# Patient Record
Sex: Female | Born: 1975 | Race: White | Hispanic: No | Marital: Married | Smoking: Never smoker
Health system: Southern US, Community
[De-identification: ages and names within clinical notes are randomized; demographics above are authoritative.]

## PROBLEM LIST (undated history)

## (undated) HISTORY — PX: KNEE SURGERY: SHX244

## (undated) HISTORY — PX: CARPAL TUNNEL RELEASE: SHX101

## (undated) HISTORY — PX: THYROIDECTOMY: SHX17

## (undated) HISTORY — PX: ABDOMINAL HYSTERECTOMY: SHX81

---

## 2016-05-10 ENCOUNTER — Emergency Department (HOSPITAL_BASED_OUTPATIENT_CLINIC_OR_DEPARTMENT_OTHER): Payer: BC Managed Care – PPO

## 2016-05-10 ENCOUNTER — Emergency Department (HOSPITAL_BASED_OUTPATIENT_CLINIC_OR_DEPARTMENT_OTHER)
Admission: EM | Admit: 2016-05-10 | Discharge: 2016-05-10 | Disposition: A | Discharge status: Home / Self Care | Payer: BC Managed Care – PPO | Attending: Emergency Medicine | Admitting: Emergency Medicine

## 2016-05-10 ENCOUNTER — Encounter (HOSPITAL_BASED_OUTPATIENT_CLINIC_OR_DEPARTMENT_OTHER): Payer: Self-pay | Admitting: *Deleted

## 2016-05-10 DIAGNOSIS — M25512 Pain in left shoulder: Secondary | ICD-10-CM

## 2016-05-10 DIAGNOSIS — R0602 Shortness of breath: Secondary | ICD-10-CM | POA: Insufficient documentation

## 2016-05-10 DIAGNOSIS — R079 Chest pain, unspecified: Secondary | ICD-10-CM | POA: Diagnosis present

## 2016-05-10 MED ORDER — LIDOCAINE 5 % EX PTCH: 1.0000 | MEDICATED_PATCH | CUTANEOUS | 0 refills | Status: DC

## 2016-05-10 MED ORDER — CYCLOBENZAPRINE HCL 10 MG PO TABS: 10.0000 mg | ORAL_TABLET | Freq: Two times a day (BID) | ORAL | 0 refills | Status: DC | PRN

## 2016-05-10 NOTE — Discharge Instructions (Signed)

## 2016-05-10 NOTE — ED Triage Notes (Signed)
Pain in her back that goes into her chest. SOB when she takes a deep breath. She had a steroid injection in her scapulas 5 days ago for numbness in both of her hands. Numbness went away after the injection.

## 2016-05-10 NOTE — ED Notes (Signed)
ED Provider at bedside. 

## 2016-05-10 NOTE — ED Provider Notes (Signed)
Emergency Department Provider Note By signing my name below, I, Teofilo PodMatthew P. Jamison, attest that this documentation has been prepared under the direction and in the presence of Maia PlanJoshua G Long, MD . Electronically Signed: Teofilo PodMatthew P. Jamison, ED Scribe. 05/10/2016. 6:15 PM.   I have reviewed the triage vital signs and the nursing notes.   HISTORY  Chief Complaint Shortness of Breath and Chest Pain   HPI Tammy Benjamin is a 40 y.o. female who presents to the Emergency Department complaining of worsening pain to her bilateral shoulder blades, duration unknown. Pt reports that the pain radiates back and forth between her left and right shoulder blades, to her chest and down her arms. Pt complains of associated numbness to both of her hands and SOB when taking deep breaths. Pt had a steroid shot in her shoulders 5 days ago that provided moderate, temporary relief for the numbness. Pt denies other associated symptoms. Patient states she's been to multiple physicians for this in the recent past with imaging of her neck.    History reviewed. No pertinent past medical history.  There are no active problems to display for this patient.   Past Surgical History:  Procedure Laterality Date  . ABDOMINAL HYSTERECTOMY    . CARPAL TUNNEL RELEASE    . KNEE SURGERY    . THYROIDECTOMY        Allergies Codeine and Penicillins  No family history on file.  Social History Social History  Substance Use Topics  . Smoking status: Never Smoker  . Smokeless tobacco: Never Used  . Alcohol use Yes    Review of Systems  Constitutional: No fever/chills Eyes: No visual changes. ENT: No sore throat. Cardiovascular: Denies chest pain. Respiratory: Positive for shortness of breath. Gastrointestinal: No abdominal pain.  No nausea, no vomiting.  No diarrhea.  No constipation. Genitourinary: Negative for dysuria. Musculoskeletal: Positive for back/shoulder pain.  Skin: Negative for  rash. Neurological: Positive for numbness. Negative for headaches, focal weakness.  10-point ROS otherwise negative.  ____________________________________________   PHYSICAL EXAM:  VITAL SIGNS: ED Triage Vitals  Enc Vitals Group     BP 05/10/16 1756 (!) 153/104     Pulse Rate 05/10/16 1756 71     Resp 05/10/16 1756 18     Temp 05/10/16 1756 98.1 F (36.7 C)     Temp Source 05/10/16 1756 Oral     SpO2 05/10/16 1756 99 %     Weight 05/10/16 1753 190 lb (86.2 kg)     Height 05/10/16 1753 5' (1.524 m)     Pain Score 05/10/16 1753 7   Constitutional: Alert and oriented. Well appearing and in no acute distress. Eyes: Conjunctivae are normal.  Head: Atraumatic. Nose: No congestion/rhinnorhea. Mouth/Throat: Mucous membranes are moist.  Oropharynx non-erythematous. Neck: No stridor. No cervical spine tenderness to palpation. Cardiovascular: Normal rate, regular rhythm. Good peripheral circulation. Grossly normal heart sounds.   Respiratory: Normal respiratory effort.  No retractions. Lungs CTAB. Gastrointestinal: Soft and nontender. No distention.  Musculoskeletal: No lower extremity tenderness nor edema. No gross deformities of extremities. Tenderness to palpation of the left posterior shoulder. Worse with movement or palpation.  Neurologic:  Normal speech and language. No gross focal neurologic deficits are appreciated.  Skin:  Skin is warm, dry and intact. No rash noted. ____________________________________________  EKG   EKG Interpretation  Date/Time:  Monday May 10 2016 18:00:25 EST Ventricular Rate:  74 PR Interval:  134 QRS Duration: 84 QT Interval:  384 QTC Calculation: 426  R Axis:   54 Text Interpretation:  Normal sinus rhythm Normal ECG No STEMI.  Confirmed by LONG MD, JOSHUA [phone]([id number]) on 05/10/2016 6:40:01 PM     '  ____________________________________________  RADIOLOGY  Dg Chest 2 View  Result Date: 05/10/2016 CLINICAL DATA:  Left shoulder blade pain  for few months and dyspnea new new EXAM: CHEST  2 VIEW COMPARISON:  None. FINDINGS: The heart size and mediastinal contours are within normal limits. Both lungs are clear. The visualized skeletal structures are unremarkable. IMPRESSION: No active cardiopulmonary disease. No acute osseous appearing abnormality. Electronically Signed   By: Tollie Ethavid  Kwon M.D.   On: 05/10/2016 18:34    ____________________________________________   PROCEDURES  Procedure(s) performed:   Procedures  None ____________________________________________   INITIAL IMPRESSION / ASSESSMENT AND PLAN / ED COURSE  Pertinent labs & imaging results that were available during my care of the patient were reviewed by me and considered in my medical decision making (see chart for details).  Patient presents to the emergency room in for evaluation of likely musculoskeletal posterior left shoulder pain. Symptoms have been ongoing for several weeks to months but worsened significantly over the past 48 hours and the patient is complaining of severe pain. Pain is nonpleuritic. The patient is low risk for pulmonary embolism. PERC negative. The pain is worse with palpation and movement. I have very low suspicion for atypical ACS. EKG is nonischemic. CXR with no acute abnormality. Discharge home with Flexeril and Lidoderm patch.   At this time, I do not feel there is any life-threatening condition present. I have reviewed and discussed all results (EKG, imaging, lab, urine as appropriate), exam findings with patient. I have reviewed nursing notes and appropriate previous records.  I feel the patient is safe to be discharged home without further emergent workup. Discussed usual and customary return precautions. Patient and family (if present) verbalize understanding and are comfortable with this plan.  Patient will follow-up with their primary care provider. If they do not have a primary care provider, information for follow-up has been  provided to them. All questions have been answered.  ____________________________________________  FINAL CLINICAL IMPRESSION(S) / ED DIAGNOSES  Final diagnoses:  Acute pain of left shoulder     MEDICATIONS GIVEN DURING THIS VISIT:  None  NEW OUTPATIENT MEDICATIONS STARTED DURING THIS VISIT:  Discharge Medication List as of 05/10/2016  6:53 PM    START taking these medications   Details  cyclobenzaprine (FLEXERIL) 10 MG tablet Take 1 tablet (10 mg total) by mouth 2 (two) times daily as needed for muscle spasms., Starting Mon 05/10/2016, Print    lidocaine (LIDODERM) 5 % Place 1 patch onto the skin daily. Remove & Discard patch within 12 hours or as directed by MD, Starting Mon 05/10/2016, Print          Note:  This document was prepared using Dragon voice recognition software and may include unintentional dictation errors.  Alona BeneJoshua Long, MD Emergency Medicine  I personally performed the services described in this documentation, which was scribed in my presence. The recorded information has been reviewed and is accurate.       Maia PlanJoshua G Long, MD 05/10/16 2239

## 2018-05-23 ENCOUNTER — Emergency Department (HOSPITAL_BASED_OUTPATIENT_CLINIC_OR_DEPARTMENT_OTHER): Payer: BC Managed Care – PPO

## 2018-05-23 ENCOUNTER — Inpatient Hospital Stay (HOSPITAL_BASED_OUTPATIENT_CLINIC_OR_DEPARTMENT_OTHER)
Admission: EM | Admit: 2018-05-23 | Discharge: 2018-05-26 | DRG: 690 | Disposition: A | Discharge status: Home / Self Care | Payer: BC Managed Care – PPO | Attending: Internal Medicine | Admitting: Internal Medicine

## 2018-05-23 ENCOUNTER — Encounter (HOSPITAL_BASED_OUTPATIENT_CLINIC_OR_DEPARTMENT_OTHER): Payer: Self-pay | Admitting: *Deleted

## 2018-05-23 ENCOUNTER — Other Ambulatory Visit: Payer: Self-pay

## 2018-05-23 DIAGNOSIS — E876 Hypokalemia: Secondary | ICD-10-CM | POA: Diagnosis not present

## 2018-05-23 DIAGNOSIS — M5412 Radiculopathy, cervical region: Secondary | ICD-10-CM

## 2018-05-23 DIAGNOSIS — N10 Acute tubulo-interstitial nephritis: Principal | ICD-10-CM | POA: Diagnosis present

## 2018-05-23 DIAGNOSIS — Z9049 Acquired absence of other specified parts of digestive tract: Secondary | ICD-10-CM | POA: Diagnosis not present

## 2018-05-23 DIAGNOSIS — Z90710 Acquired absence of both cervix and uterus: Secondary | ICD-10-CM

## 2018-05-23 DIAGNOSIS — R945 Abnormal results of liver function studies: Secondary | ICD-10-CM

## 2018-05-23 DIAGNOSIS — E890 Postprocedural hypothyroidism: Secondary | ICD-10-CM | POA: Diagnosis not present

## 2018-05-23 DIAGNOSIS — Z79899 Other long term (current) drug therapy: Secondary | ICD-10-CM | POA: Diagnosis not present

## 2018-05-23 DIAGNOSIS — R7989 Other specified abnormal findings of blood chemistry: Secondary | ICD-10-CM

## 2018-05-23 DIAGNOSIS — N12 Tubulo-interstitial nephritis, not specified as acute or chronic: Secondary | ICD-10-CM | POA: Diagnosis present

## 2018-05-23 DIAGNOSIS — R109 Unspecified abdominal pain: Secondary | ICD-10-CM | POA: Diagnosis present

## 2018-05-23 LAB — CBC WITH DIFFERENTIAL/PLATELET
Abs Immature Granulocytes: 0.03 10*3/uL (ref 0.00–0.07)
Basophils Absolute: 0 10*3/uL (ref 0.0–0.1)
Basophils Relative: 0 %
EOS ABS: 0.1 10*3/uL (ref 0.0–0.5)
EOS PCT: 1 %
HEMATOCRIT: 39.5 % (ref 36.0–46.0)
Hemoglobin: 13.4 g/dL (ref 12.0–15.0)
IMMATURE GRANULOCYTES: 0 %
LYMPHS ABS: 1.7 10*3/uL (ref 0.7–4.0)
Lymphocytes Relative: 17 %
MCH: 29.8 pg (ref 26.0–34.0)
MCHC: 33.9 g/dL (ref 30.0–36.0)
MCV: 87.8 fL (ref 80.0–100.0)
MONOS PCT: 10 %
Monocytes Absolute: 1 10*3/uL (ref 0.1–1.0)
Neutro Abs: 7.1 10*3/uL (ref 1.7–7.7)
Neutrophils Relative %: 72 %
Platelets: 223 10*3/uL (ref 150–400)
RBC: 4.5 MIL/uL (ref 3.87–5.11)
RDW: 12.1 % (ref 11.5–15.5)
WBC: 10.1 10*3/uL (ref 4.0–10.5)
nRBC: 0 % (ref 0.0–0.2)

## 2018-05-23 LAB — URINALYSIS, ROUTINE W REFLEX MICROSCOPIC
Bilirubin Urine: NEGATIVE
Glucose, UA: 100 mg/dL — AB
KETONES UR: NEGATIVE mg/dL
NITRITE: POSITIVE — AB
PROTEIN: 100 mg/dL — AB
Specific Gravity, Urine: 1.01 (ref 1.005–1.030)
pH: 6.5 (ref 5.0–8.0)

## 2018-05-23 LAB — COMPREHENSIVE METABOLIC PANEL
ALK PHOS: 54 U/L (ref 38–126)
ALT: 14 U/L (ref 0–44)
AST: 15 U/L (ref 15–41)
Albumin: 4 g/dL (ref 3.5–5.0)
Anion gap: 9 (ref 5–15)
BILIRUBIN TOTAL: 0.5 mg/dL (ref 0.3–1.2)
BUN: 15 mg/dL (ref 6–20)
CO2: 28 mmol/L (ref 22–32)
Calcium: 9.2 mg/dL (ref 8.9–10.3)
Chloride: 103 mmol/L (ref 98–111)
Creatinine, Ser: 0.81 mg/dL (ref 0.44–1.00)
GFR calc Af Amer: >60 mL/min (ref >60)
Glucose, Bld: 92 mg/dL (ref 70–99)
POTASSIUM: 2.8 mmol/L — AB (ref 3.5–5.1)
Sodium: 140 mmol/L (ref 135–145)
Total Protein: 6.8 g/dL (ref 6.5–8.1)

## 2018-05-23 LAB — URINALYSIS, MICROSCOPIC (REFLEX)

## 2018-05-23 LAB — LIPASE, BLOOD: Lipase: 41 U/L (ref 11–51)

## 2018-05-23 LAB — HCG, SERUM, QUALITATIVE: PREG SERUM: NEGATIVE

## 2018-05-23 MED ORDER — POTASSIUM CHLORIDE CRYS ER 20 MEQ PO TBCR
40.0000 meq | EXTENDED_RELEASE_TABLET | Freq: Once | ORAL | Status: AC
  Administered 2018-05-23: 40 meq via ORAL
  Filled 2018-05-23: qty 2

## 2018-05-23 MED ORDER — IOPAMIDOL (ISOVUE-300) INJECTION 61%
100.0000 mL | Freq: Once | INTRAVENOUS | Status: AC | PRN
  Administered 2018-05-23: 100 mL via INTRAVENOUS

## 2018-05-23 MED ORDER — HYDROMORPHONE HCL 1 MG/ML IJ SOLN
0.5000 mg | Freq: Once | INTRAMUSCULAR | Status: AC
  Administered 2018-05-23: 0.5 mg via INTRAVENOUS
  Filled 2018-05-23: qty 1

## 2018-05-23 MED ORDER — SODIUM CHLORIDE 0.9 % IV BOLUS
1000.0000 mL | Freq: Once | INTRAVENOUS | Status: AC
  Administered 2018-05-23: 1000 mL via INTRAVENOUS

## 2018-05-23 MED ORDER — OXYCODONE-ACETAMINOPHEN 5-325 MG PO TABS
1.0000 | ORAL_TABLET | Freq: Once | ORAL | Status: AC
  Administered 2018-05-23: 1 via ORAL
  Filled 2018-05-23: qty 1

## 2018-05-23 MED ORDER — SODIUM CHLORIDE 0.9 % IV SOLN
1.0000 g | Freq: Once | INTRAVENOUS | Status: AC
  Administered 2018-05-23: 1 g via INTRAVENOUS
  Filled 2018-05-23: qty 10

## 2018-05-23 NOTE — ED Provider Notes (Signed)
[facility] EMERGENCY DEPARTMENT Provider Note   CSN: [id number] Arrival date & time: 05/23/18  1719     History   Chief Complaint Chief Complaint  Patient presents with  . Abdominal Pain    HPI Tammy Benjamin is a 42 y.o. female presents today for right-sided flank and abdominal pain that began 7 days ago.  Patient states that the pain began as a mild intensity aching pain with associated dysuria however has progressed since that time.  Patient states that she was evaluated by a tele-doc 5 days ago and prescribed Macrobid, tamsulosin and given follow-up precautions.  Patient states that her symptoms have continued to worsen since that time.  She states that she vomited once 4 days ago nonbloody, nonbilious.  Patient states that her pain is now 10/10 in severity, constant and throbbing in nature radiating from right flank to right abdomen.  Patient also endorses dysuria, urinary frequency as well as new onset nonbloody diarrhea that began yesterday.  HPI  History reviewed. No pertinent past medical history.  Patient Active Problem List   Diagnosis Date Noted  . Pyelonephritis of right kidney 05/24/2018  . Cervical radiculopathy 05/24/2018  . Pyelonephritis 05/23/2018    Past Surgical History:  Procedure Laterality Date  . ABDOMINAL HYSTERECTOMY    . CARPAL TUNNEL RELEASE    . KNEE SURGERY    . THYROIDECTOMY       OB History   None      Home Medications    Prior to Admission medications   Medication Sig Start Date End Date Taking? Authorizing Provider  celecoxib (CELEBREX) 100 MG capsule Take 100 mg by mouth 2 (two) times daily.   Yes [provider]  diphenhydramine-acetaminophen (TYLENOL PM) 25-500 MG TABS tablet Take 2 tablets by mouth at bedtime as needed (sleep).   Yes [provider]  escitalopram (LEXAPRO) 10 MG tablet Take 10 mg by mouth daily. 03/20/18  Yes [provider]  gabapentin (NEURONTIN) 100 MG capsule Take 100 mg  by mouth 3 (three) times daily. 05/02/18  Yes [provider]  pregabalin (LYRICA) 50 MG capsule Take 50 mg by mouth 3 (three) times daily.   Yes [provider]  tiZANidine (ZANAFLEX) 2 MG tablet Take 2 mg by mouth every 6 (six) hours as needed for muscle spasms.  05/01/18  Yes [provider]  tiZANidine (ZANAFLEX) 4 MG tablet Take 4 mg by mouth every 6 (six) hours as needed for muscle spasms.   Yes [provider]  VYVANSE 70 MG capsule Take 70 mg by mouth daily as needed (focus).  05/01/18  Yes [provider]  cyclobenzaprine (FLEXERIL) 10 MG tablet Take 1 tablet (10 mg total) by mouth 2 (two) times daily as needed for muscle spasms. Patient not taking: Reported on 05/24/2018 05/10/16   Long, Arlyss Repress, MD  lidocaine (LIDODERM) 5 % Place 1 patch onto the skin daily. Remove & Discard patch within 12 hours or as directed by MD Patient not taking: Reported on 05/24/2018 05/10/16   Long, Arlyss Repress, MD    Family History No family history on file.  Social History Social History   Tobacco Use  . Smoking status: Never Smoker  . Smokeless tobacco: Never Used  Substance Use Topics  . Alcohol use: Yes  . Drug use: No     Allergies   Bee venom; Hydrocodone; and Penicillins   Review of Systems Review of Systems  Constitutional: Negative.  Negative for chills and fever.  HENT: Negative.  Negative for rhinorrhea and sore throat.   Eyes: Negative.  Negative for visual disturbance.  Respiratory: Negative.  Negative for cough and shortness of breath.   Cardiovascular: Negative.  Negative for chest pain.  Gastrointestinal: Positive for abdominal pain, diarrhea and vomiting. Negative for blood in stool.  Genitourinary: Positive for dysuria, flank pain and frequency. Negative for hematuria, vaginal bleeding and vaginal discharge.  Musculoskeletal: Negative for arthralgias and myalgias.  Skin: Negative.  Negative for rash.  Neurological: Negative.   Negative for dizziness, weakness and headaches.   Physical Exam Updated Vital Signs BP 122/73 (BP Location: Left Arm)   Pulse (!) 57   Temp (!) 97.4 F (36.3 C) (Oral)   Resp 17   Ht 5' (1.524 m)   Wt 86.2 kg   SpO2 99%   BMI 37.11 kg/m   Physical Exam  Constitutional: She appears well-developed and well-nourished. No distress.  HENT:  Head: Normocephalic and atraumatic.  Right Ear: External ear normal.  Left Ear: External ear normal.  Nose: Nose normal.  Mouth/Throat: Uvula is midline, oropharynx is clear and moist and mucous membranes are normal.  Well-healed surgical scar post thyroidectomy.  Eyes: Pupils are equal, round, and reactive to light. Conjunctivae and EOM are normal.  Neck: Trachea normal, normal range of motion, full passive range of motion without pain and phonation normal. Neck supple. No tracheal tenderness present. No tracheal deviation present.  Cardiovascular: Normal rate and regular rhythm.  Pulses:      Radial pulses are 2+ on the right side, and 2+ on the left side.       Dorsalis pedis pulses are 2+ on the right side, and 2+ on the left side.       Posterior tibial pulses are 2+ on the right side, and 2+ on the left side.  Pulmonary/Chest: Effort normal and breath sounds normal. No respiratory distress. She exhibits no tenderness, no crepitus and no deformity.  Abdominal: Soft. There is tenderness in the right upper quadrant and right lower quadrant. There is rebound, CVA tenderness and tenderness at McBurney's point. There is no guarding and negative Murphy's sign.  Genitourinary:  Genitourinary Comments: Deferred by patient  Musculoskeletal: Normal range of motion.       Back:       Right lower leg: Normal.       Left lower leg: Normal.  Patient with right-sided flank tenderness.  No crepitus step-off or deformity of the spine.  No midline spinal tenderness to palpation.  No signs of injury to the back.  Feet:  Right Foot:  Protective Sensation:  3 sites tested. 3 sites sensed.  Left Foot:  Protective Sensation: 3 sites tested. 3 sites sensed.  Neurological: She is alert. GCS eye subscore is 4. GCS verbal subscore is 5. GCS motor subscore is 6.  Speech is clear and goal oriented, follows commands Major Cranial nerves without deficit, no facial droop Normal strength in upper and lower extremities bilaterally including dorsiflexion and plantar flexion, strong and equal grip strength Sensation normal to light touch Moves extremities without ataxia, coordination intact  Skin: Skin is warm and dry.  Psychiatric: She has a normal mood and affect. Her behavior is normal.   ED Treatments / Results  Labs (all labs ordered are listed, but only abnormal results are displayed) Labs Reviewed  URINE CULTURE - Abnormal; Notable for the following components:      Result Value   Culture MULTIPLE SPECIES PRESENT, SUGGEST RECOLLECTION (*)  All other components within normal limits  URINALYSIS, ROUTINE W REFLEX MICROSCOPIC - Abnormal; Notable for the following components:   APPearance CLOUDY (*)    Glucose, UA 100 (*)    Hgb urine dipstick MODERATE (*)    Protein, ur 100 (*)    Nitrite POSITIVE (*)    Leukocytes, UA LARGE (*)    All other components within normal limits  URINALYSIS, MICROSCOPIC (REFLEX) - Abnormal; Notable for the following components:   Bacteria, UA FEW (*)    All other components within normal limits  COMPREHENSIVE METABOLIC PANEL - Abnormal; Notable for the following components:   Potassium 2.8 (*)    All other components within normal limits  BASIC METABOLIC PANEL - Abnormal; Notable for the following components:   Calcium 8.5 (*)    Anion gap 4 (*)    All other components within normal limits  COMPREHENSIVE METABOLIC PANEL - Abnormal; Notable for the following components:   Glucose, Bld 103 (*)    Calcium 8.5 (*)    Total Protein 6.2 (*)    Albumin 3.4 (*)    AST 65 (*)    ALT 132 (*)    All other components  within normal limits  CBC WITH DIFFERENTIAL/PLATELET  LIPASE, BLOOD  HCG, SERUM, QUALITATIVE  HIV ANTIBODY (ROUTINE TESTING W REFLEX)  CBC  MAGNESIUM  CBC  MAGNESIUM  CBC WITH DIFFERENTIAL/PLATELET  COMPREHENSIVE METABOLIC PANEL  MAGNESIUM  HEPATITIS PANEL, ACUTE    EKG EKG Interpretation  Date/Time:  Wednesday May 24 2018 00:27:06 EST Ventricular Rate:  70 PR Interval:    QRS Duration: 97 QT Interval:  417 QTC Calculation: 450 R Axis:   30 Text Interpretation:  Sinus rhythm No significant change since last tracing Confirmed by Melene PlanFloyd, Dan [phone]([id number]) on 05/24/2018 11:26:35 AM   Radiology Ct Abdomen Pelvis W Contrast  Result Date: 05/23/2018 CLINICAL DATA:  Right flank and abdominal pain. Recent treatment for urinary tract infection and renal stone. Prior cholecystectomy and hysterectomy. EXAM: CT ABDOMEN AND PELVIS WITH CONTRAST TECHNIQUE: Multidetector CT imaging of the abdomen and pelvis was performed using the standard protocol following bolus administration of intravenous contrast. CONTRAST:  100mL ISOVUE-300 IOPAMIDOL (ISOVUE-300) INJECTION 61% COMPARISON:  None. FINDINGS: Lower chest: No significant pulmonary nodules or acute consolidative airspace disease. Hepatobiliary: Normal liver size. No liver mass. Small venovenous shunt in the right liver dome (series 2/image 14). Cholecystectomy. No biliary ductal dilatation. Pancreas: Normal, with no mass or duct dilation. Spleen: Normal size. No mass. Adrenals/Urinary Tract: Normal adrenals. Subtle patchy hypoperfusion in the interpolar right kidney. No right renal masses. Scattered subcentimeter hypodense renal cortical lesions in the upper left kidney are too small to characterize and require no follow-up. No perinephric collections. No hydronephrosis. Asymmetric mild urothelial wall thickening and hyperenhancement in the right renal collecting system and throughout the right ureter. Normal bladder. Stomach/Bowel: Normal  non-distended stomach. Normal caliber small bowel with no small bowel wall thickening. Normal appendix. Mild descending colonic diverticulosis. No large bowel wall thickening or acute pericolonic fat stranding. Vascular/Lymphatic: Normal caliber abdominal aorta. Patent portal, splenic, hepatic and renal veins. No pathologically enlarged lymph nodes in the abdomen or pelvis. Reproductive: Status post hysterectomy, with no abnormal findings at the vaginal cuff. No adnexal mass. Other: No pneumoperitoneum, ascites or focal fluid collection. Small superior fat containing periumbilical hernia. Musculoskeletal: No aggressive appearing focal osseous lesions. IMPRESSION: 1. Ascending right urinary tract infection with uncomplicated right pyelonephritis. No hydronephrosis. No perinephric fluid collections. No renal abscess. 2. Mild  descending colonic diverticulosis. 3. Small superior periumbilical fat containing hernia. Electronically Signed   By: Delbert Phenix M.D.   On: 05/23/2018 19:29   US Abdomen Limited Ruq  Result Date: 05/25/2018 CLINICAL DATA:  Abnormal liver function tests. EXAM: ULTRASOUND ABDOMEN LIMITED RIGHT UPPER QUADRANT COMPARISON:  None. FINDINGS: Gallbladder: Previous cholecystectomy. Common bile duct: Diameter: 6 mm.  Normal. Liver: No focal lesion identified. Within normal limits in parenchymal echogenicity. Portal vein is patent on color Doppler imaging with normal direction of blood flow towards the liver. IMPRESSION: Previous cholecystectomy. Otherwise normal exam. No abnormality seen to explain the laboratory abnormalities. Electronically Signed   By: Paulina Fusi M.D.   On: 05/25/2018 09:31    Procedures Procedures (including critical care time)  Medications Ordered in ED Medications  cefTRIAXone (ROCEPHIN) 1 g in sodium chloride 0.9 % 100 mL IVPB ( Intravenous Stopped 05/24/18 1604)  tiZANidine (ZANAFLEX) tablet 4 mg (4 mg Oral Given 05/25/18 1153)  acetaminophen (TYLENOL) tablet 650  mg (650 mg Oral Given 05/24/18 1942)    Or  acetaminophen (TYLENOL) suppository 650 mg ( Rectal See Alternative 05/24/18 1942)  ketorolac (TORADOL) 30 MG/ML injection 30 mg (30 mg Intravenous Given 05/25/18 0142)  senna-docusate (Senokot-S) tablet 1 tablet (has no administration in time range)  bisacodyl (DULCOLAX) EC tablet 5 mg (has no administration in time range)  pregabalin (LYRICA) capsule 50 mg (50 mg Oral Given 05/25/18 0959)  ondansetron (ZOFRAN) injection 4 mg (4 mg Intravenous Given 05/25/18 1154)  traMADol (ULTRAM) tablet 50 mg (50 mg Oral Given 05/25/18 1153)  sodium chloride 0.9 % bolus 1,000 mL ( Intravenous Stopped 05/23/18 1855)  iopamidol (ISOVUE-300) 61 % injection 100 mL (100 mLs Intravenous Contrast Given 05/23/18 1857)  HYDROmorphone (DILAUDID) injection 0.5 mg (0.5 mg Intravenous Given 05/23/18 1926)  cefTRIAXone (ROCEPHIN) 1 g in sodium chloride 0.9 % 100 mL IVPB ( Intravenous Stopped 05/23/18 2108)  potassium chloride SA (K-DUR,KLOR-CON) CR tablet 40 mEq (40 mEq Oral Given 05/23/18 2005)  oxyCODONE-acetaminophen (PERCOCET/ROXICET) 5-325 MG per tablet 1 tablet (1 tablet Oral Given 05/23/18 2005)  HYDROmorphone (DILAUDID) injection 0.5 mg (0.5 mg Intravenous Given 05/23/18 2133)  potassium chloride 10 mEq in 100 mL IVPB ( Intravenous Stopped 05/24/18 0231)  ketorolac (TORADOL) 15 MG/ML injection 15 mg (15 mg Intravenous Given 05/24/18 0737)  acetaminophen (TYLENOL) tablet 1,000 mg (1,000 mg Oral Given 05/24/18 0738)  ondansetron (ZOFRAN) injection 4 mg (4 mg Intravenous Given 05/24/18 0843)  HYDROmorphone (DILAUDID) injection 0.5 mg (0.5 mg Intravenous Given 05/24/18 0843)     Initial Impression / Assessment and Plan / ED Course  I have reviewed the triage vital signs and the nursing notes.  Pertinent labs & imaging results that were available during my care of the patient were reviewed by me and considered in my medical decision making (see chart for  details).  Clinical Course as of May 25 1799  Tue May 23, 2018  1919 Patient states that her codeine reaction was 7 years ago.  Chart reviewed showing that patient received Dilaudid and oxycodone after her hysterectomy in August 2016.  Patient states that she did not have a reaction to these medications and they worked well for her.  Unclear history now, discussed with patient who is adamant on timeline.  Will give small dose of Dilaudid here and monitor.  Discussed with Dr. Jacqulyn Bath who agrees.   [BM]  1947 Rophephin 1g; kdur 40; oxycod    [BM]  1955 Patient with history of rash to amoxicillin,  no history of anaphylaxis to amoxicillin.  Discussed with Dr. Jacqulyn Bath, will give Rocephin.   [BM]  2054 Patient reassessed no acute distress.  Patient chewing ice chips without difficulty or vomiting.  Patient still endorsing severe pain to her right flank.   [BM]  2123 Patient reassessed, tearful on examination, stating 10/10 pain to the right flank not improved following medication today.  I have spoken to the patient and her husband at length about care plan today, with inadequate pain control following Percocet admission was discussed with the patient for continued pain control and antibiotics for her pyelonephritis.  Patient is agreeable to this care plan.   [BM]  2208 Discussed with Dr. Antionette Char hospitalist at [facility] who has accepted patient to his service.   [BM]    Clinical Course User Index [BM] Bill Salinas, PA-C    42 year old female presenting for continued Right Flank pain and Right Sided abdominal pain that has continued despite out patient macrobid treatment.  Pregnancy test Negative Lipase WNL CBC WNL CMP shows hypokalemia- begun oral replacement Urinalysis with likely UTI -sent for culture CT abd/pelvis:  IMPRESSION:  1. Ascending right urinary tract infection with uncomplicated right  pyelonephritis. No hydronephrosis. No perinephric fluid collections.  No renal abscess.   2. Mild descending colonic diverticulosis.  3. Small superior periumbilical fat containing hernia.   Fluids given, IV rocephin given, attempted to control pain with dilaudid and then percocet without improvement.  Discussed with attending Dr. Jacqulyn Bath; patient to be admitted for continued pain control and IV antibiotics in the setting of pyelonephritis.  Vital signs have remained stable throughout ED visit. No tachycardia or hypotension, patient does not meet Sepsis/Sirs criteria.  Patient has been accepted to hospitalist service by Dr. Antionette Char. Care has been taken over by Dr. Antionette Char, patient awaiting bed at [facility].   Note: Portions of this report may have been transcribed using voice recognition software. Every effort was made to ensure accuracy; however, inadvertent computerized transcription errors may still be present. Final Clinical Impressions(s) / ED Diagnoses   Final diagnoses:  Pyelonephritis  Hypokalemia    ED Discharge Orders    None       Elizabeth Palau 05/25/18 1806    Maia Plan, MD 05/26/18 [phone]

## 2018-05-23 NOTE — ED Triage Notes (Signed)
Right flank and abdominal pain. She was treated for a UTI and kidney stone starting on 11/14. She completed the medication with no improvement. Decreased urine output.

## 2018-05-24 DIAGNOSIS — Z9049 Acquired absence of other specified parts of digestive tract: Secondary | ICD-10-CM | POA: Diagnosis not present

## 2018-05-24 DIAGNOSIS — Z79899 Other long term (current) drug therapy: Secondary | ICD-10-CM | POA: Diagnosis not present

## 2018-05-24 DIAGNOSIS — M5412 Radiculopathy, cervical region: Secondary | ICD-10-CM | POA: Diagnosis present

## 2018-05-24 DIAGNOSIS — N10 Acute tubulo-interstitial nephritis: Secondary | ICD-10-CM | POA: Diagnosis present

## 2018-05-24 DIAGNOSIS — N12 Tubulo-interstitial nephritis, not specified as acute or chronic: Secondary | ICD-10-CM | POA: Diagnosis not present

## 2018-05-24 DIAGNOSIS — Z90710 Acquired absence of both cervix and uterus: Secondary | ICD-10-CM | POA: Diagnosis not present

## 2018-05-24 DIAGNOSIS — E890 Postprocedural hypothyroidism: Secondary | ICD-10-CM | POA: Diagnosis present

## 2018-05-24 DIAGNOSIS — E876 Hypokalemia: Secondary | ICD-10-CM | POA: Diagnosis present

## 2018-05-24 DIAGNOSIS — R945 Abnormal results of liver function studies: Secondary | ICD-10-CM | POA: Diagnosis not present

## 2018-05-24 DIAGNOSIS — R109 Unspecified abdominal pain: Secondary | ICD-10-CM | POA: Diagnosis present

## 2018-05-24 LAB — CBC
HEMATOCRIT: 39.5 % (ref 36.0–46.0)
HEMOGLOBIN: 13.1 g/dL (ref 12.0–15.0)
MCH: 30 pg (ref 26.0–34.0)
MCHC: 33.2 g/dL (ref 30.0–36.0)
MCV: 90.6 fL (ref 80.0–100.0)
Platelets: 205 10*3/uL (ref 150–400)
RBC: 4.36 MIL/uL (ref 3.87–5.11)
RDW: 12.2 % (ref 11.5–15.5)
WBC: 4.9 10*3/uL (ref 4.0–10.5)
nRBC: 0 % (ref 0.0–0.2)

## 2018-05-24 LAB — BASIC METABOLIC PANEL
Anion gap: 4 — ABNORMAL LOW (ref 5–15)
BUN: 10 mg/dL (ref 6–20)
CALCIUM: 8.5 mg/dL — AB (ref 8.9–10.3)
CHLORIDE: 108 mmol/L (ref 98–111)
CO2: 32 mmol/L (ref 22–32)
CREATININE: 0.74 mg/dL (ref 0.44–1.00)
GFR calc non Af Amer: >60 mL/min (ref >60)
Glucose, Bld: 97 mg/dL (ref 70–99)
Potassium: 3.5 mmol/L (ref 3.5–5.1)
Sodium: 144 mmol/L (ref 135–145)

## 2018-05-24 LAB — MAGNESIUM: Magnesium: 2.1 mg/dL (ref 1.7–2.4)

## 2018-05-24 MED ORDER — POTASSIUM CHLORIDE 10 MEQ/100ML IV SOLN
10.0000 meq | INTRAVENOUS | Status: AC
  Administered 2018-05-24 (×2): 10 meq via INTRAVENOUS

## 2018-05-24 MED ORDER — ONDANSETRON HCL 4 MG/2ML IJ SOLN
4.0000 mg | Freq: Four times a day (QID) | INTRAMUSCULAR | Status: DC | PRN
  Administered 2018-05-24 – 2018-05-25 (×4): 4 mg via INTRAVENOUS
  Filled 2018-05-24 (×4): qty 2

## 2018-05-24 MED ORDER — BISACODYL 5 MG PO TBEC: 5.0000 mg | DELAYED_RELEASE_TABLET | Freq: Every day | ORAL | Status: DC | PRN

## 2018-05-24 MED ORDER — ACETAMINOPHEN 500 MG PO TABS
1000.0000 mg | ORAL_TABLET | Freq: Once | ORAL | Status: AC
  Administered 2018-05-24: 1000 mg via ORAL
  Filled 2018-05-24: qty 2

## 2018-05-24 MED ORDER — HYDROMORPHONE HCL 1 MG/ML IJ SOLN
0.5000 mg | Freq: Once | INTRAMUSCULAR | Status: AC
  Administered 2018-05-24: 0.5 mg via INTRAVENOUS
  Filled 2018-05-24: qty 1

## 2018-05-24 MED ORDER — PREGABALIN 50 MG PO CAPS
50.0000 mg | ORAL_CAPSULE | Freq: Every day | ORAL | Status: DC
  Administered 2018-05-24 – 2018-05-25 (×2): 50 mg via ORAL
  Filled 2018-05-24 (×2): qty 1

## 2018-05-24 MED ORDER — SODIUM CHLORIDE 0.9 % IV SOLN
INTRAVENOUS | Status: DC
  Administered 2018-05-24: 16:00:00 via INTRAVENOUS

## 2018-05-24 MED ORDER — ACETAMINOPHEN 325 MG PO TABS
650.0000 mg | ORAL_TABLET | Freq: Four times a day (QID) | ORAL | Status: DC | PRN
  Administered 2018-05-24: 650 mg via ORAL
  Filled 2018-05-24 (×2): qty 2

## 2018-05-24 MED ORDER — ACETAMINOPHEN 650 MG RE SUPP: 650.0000 mg | Freq: Four times a day (QID) | RECTAL | Status: DC | PRN

## 2018-05-24 MED ORDER — KETOROLAC TROMETHAMINE 30 MG/ML IJ SOLN
30.0000 mg | Freq: Four times a day (QID) | INTRAMUSCULAR | Status: DC | PRN
  Administered 2018-05-24 – 2018-05-25 (×4): 30 mg via INTRAVENOUS
  Filled 2018-05-24 (×4): qty 1

## 2018-05-24 MED ORDER — ENOXAPARIN SODIUM 40 MG/0.4ML ~~LOC~~ SOLN
40.0000 mg | SUBCUTANEOUS | Status: DC
  Administered 2018-05-24: 40 mg via SUBCUTANEOUS
  Filled 2018-05-24: qty 0.4

## 2018-05-24 MED ORDER — ONDANSETRON HCL 4 MG/2ML IJ SOLN
4.0000 mg | Freq: Once | INTRAMUSCULAR | Status: AC
  Administered 2018-05-24: 4 mg via INTRAVENOUS
  Filled 2018-05-24: qty 2

## 2018-05-24 MED ORDER — SODIUM CHLORIDE 0.9 % IV SOLN
1.0000 g | INTRAVENOUS | Status: DC
  Filled 2018-05-24: qty 10

## 2018-05-24 MED ORDER — KETOROLAC TROMETHAMINE 15 MG/ML IJ SOLN
15.0000 mg | Freq: Once | INTRAMUSCULAR | Status: AC
  Administered 2018-05-24: 15 mg via INTRAVENOUS
  Filled 2018-05-24: qty 1

## 2018-05-24 MED ORDER — SENNOSIDES-DOCUSATE SODIUM 8.6-50 MG PO TABS: 1.0000 | ORAL_TABLET | Freq: Every evening | ORAL | Status: DC | PRN

## 2018-05-24 MED ORDER — SODIUM CHLORIDE 0.9 % IV SOLN
1.0000 g | INTRAVENOUS | Status: DC
  Administered 2018-05-24 – 2018-05-25 (×2): 1 g via INTRAVENOUS
  Filled 2018-05-24 (×2): qty 1

## 2018-05-24 MED ORDER — TIZANIDINE HCL 4 MG PO TABS
4.0000 mg | ORAL_TABLET | Freq: Four times a day (QID) | ORAL | Status: DC | PRN
  Administered 2018-05-25 – 2018-05-26 (×3): 4 mg via ORAL
  Filled 2018-05-24 (×3): qty 1

## 2018-05-24 NOTE — ED Notes (Signed)
Pts rocephin due at 2000.

## 2018-05-24 NOTE — ED Notes (Addendum)
Report given to Calvin at [facility]. Calvin informed that rocephin due at [id number].

## 2018-05-24 NOTE — H&P (Signed)
History and Physical    Tammy Benjamin VWU:[id number] DOB: 05-03-1976 DOA: 05/23/2018  PCP: Maia Plan, MD Patient coming from: Home  Chief Complaint: Right-sided flank pain  HPI: Tammy Benjamin is a 42 y.o. female with medical history significant of with history of cervical radiculopathy coming to the hospital for evaluation of right-sided flank pain.  She experienced dysuria about a week ago and was treated with 5 days of oral Macrobid, Flomax and Pyridium.  At first she felt better but then starting yesterday morning she started experiencing the symptoms again and felt much worst.  They were accompanied by nausea and vomiting, subjective fevers and chills as well.  She returned to urgent care center and [facility] and was diagnosed with right-sided pyelonephritis seen on the CT scan.  She was hypokalemic therefore repletion was started.  She was getting ready to be discharged but her pain was significantly worse and unable to tolerate oral intake therefore admitted to the hospital for further management.  When I saw the patient here at [facility] on the floor she was reporting of right-sided flank pain which was mostly intermittent in nature.  Much better after getting IV pain medications.   Review of Systems: As per HPI otherwise 10 point review of systems negative.  Review of Systems Otherwise negative except as per HPI, including: General: Denies fever, chills, night sweats or unintended weight loss. Resp: Denies cough, wheezing, shortness of breath. Cardiac: Denies chest pain, palpitations, orthopnea, paroxysmal nocturnal dyspnea. GI: Denies abdominal pain, nausea, vomiting, diarrhea or constipation GU: Denies  hesitancy or incontinence MS: Denies muscle aches, joint pain or swelling Neuro: Denies headache, neurologic deficits (focal weakness, numbness, tingling), abnormal gait Psych: Denies anxiety, depression, SI/HI/AVH Skin: Denies new rashes or lesions ID: Denies  sick contacts, exotic exposures, travel  History reviewed. No pertinent past medical history.  Past Surgical History:  Procedure Laterality Date  . ABDOMINAL HYSTERECTOMY    . CARPAL TUNNEL RELEASE    . KNEE SURGERY    . THYROIDECTOMY      SOCIAL HISTORY:  reports that she has never smoked. She has never used smokeless tobacco. She reports that she drinks alcohol. She reports that she does not use drugs.  Allergies  Allergen Reactions  . Codeine Anaphylaxis  . Penicillins     rash    FAMILY HISTORY: No family history on file.   Prior to Admission medications   Medication Sig Start Date End Date Taking? Authorizing Provider  Celecoxib (CELEBREX PO) Take by mouth.   Yes [provider]  Pregabalin (LYRICA PO) Take by mouth.   Yes [provider]  tiZANidine HCl (ZANAFLEX PO) Take by mouth.   Yes [provider]  cyclobenzaprine (FLEXERIL) 10 MG tablet Take 1 tablet (10 mg total) by mouth 2 (two) times daily as needed for muscle spasms. 05/10/16   Long, Arlyss Repress, MD  lidocaine (LIDODERM) 5 % Place 1 patch onto the skin daily. Remove & Discard patch within 12 hours or as directed by MD 05/10/16   Maia Plan, MD    Physical Exam: Vitals:   05/24/18 0744 05/24/18 1014 05/24/18 1246 05/24/18 1343  BP: (!) 139/91 (!) 139/92 (!) 151/103 (!) 142/92  Pulse: 72 66 81 74  Resp: 18 18 20    Temp: 97.8 F (36.6 C)   98.6 F (37 C)  TempSrc: Oral   Oral  SpO2: 100% 95% 100% 96%  Weight:      Height:  Constitutional: NAD, calm, comfortable Eyes: PERRL, lids and conjunctivae normal ENMT: Mucous membranes are moist. Posterior pharynx clear of any exudate or lesions.Normal dentition.  Neck: normal, supple, no masses, no thyromegaly Respiratory: clear to auscultation bilaterally, no wheezing, no crackles. Normal respiratory effort. No accessory muscle use.  Cardiovascular: Regular rate and rhythm, no murmurs / rubs / gallops. No extremity edema.  2+ pedal pulses. No carotid bruits.  Abdomen: no tenderness, no masses palpated. No hepatosplenomegaly. Bowel sounds positive.  Musculoskeletal: no clubbing / cyanosis. No joint deformity upper and lower extremities. Good ROM, no contractures. Normal muscle tone.  Skin: no rashes, lesions, ulcers. No induration Neurologic: CN 2-12 grossly intact. Sensation intact, DTR normal. Strength 5/5 in all 4.  Psychiatric: Normal judgment and insight. Alert and oriented x 3. Normal mood.     Labs on Admission: I have personally reviewed following labs and imaging studies  CBC: Recent Labs  Lab 05/23/18 1807  WBC 10.1  NEUTROABS 7.1  HGB 13.4  HCT 39.5  MCV 87.8  PLT 223   Basic Metabolic Panel: Recent Labs  Lab 05/23/18 1807  NA 140  K 2.8*  CL 103  CO2 28  GLUCOSE 92  BUN 15  CREATININE 0.81  CALCIUM 9.2   GFR: Estimated Creatinine Clearance: 87.4 mL/min (by C-G formula based on SCr of 0.81 mg/dL). Liver Function Tests: Recent Labs  Lab 05/23/18 1807  AST 15  ALT 14  ALKPHOS 54  BILITOT 0.5  PROT 6.8  ALBUMIN 4.0   Recent Labs  Lab 05/23/18 1807  LIPASE 41   No results for input(s): AMMONIA in the last 168 hours. Coagulation Profile: No results for input(s): INR, PROTIME in the last 168 hours. Cardiac Enzymes: No results for input(s): CKTOTAL, CKMB, CKMBINDEX, TROPONINI in the last 168 hours. BNP (last 3 results) No results for input(s): PROBNP in the last 8760 hours. HbA1C: No results for input(s): HGBA1C in the last 72 hours. CBG: No results for input(s): GLUCAP in the last 168 hours. Lipid Profile: No results for input(s): CHOL, HDL, LDLCALC, TRIG, CHOLHDL, LDLDIRECT in the last 72 hours. Thyroid Function Tests: No results for input(s): TSH, T4TOTAL, FREET4, T3FREE, THYROIDAB in the last 72 hours. Anemia Panel: No results for input(s): VITAMINB12, FOLATE, FERRITIN, TIBC, IRON, RETICCTPCT in the last 72 hours. Urine analysis:    Component Value  Date/Time   COLORURINE YELLOW 05/23/2018 1737   APPEARANCEUR CLOUDY (A) 05/23/2018 1737   LABSPEC 1.010 05/23/2018 1737   PHURINE 6.5 05/23/2018 1737   GLUCOSEU 100 (A) 05/23/2018 1737   HGBUR MODERATE (A) 05/23/2018 1737   BILIRUBINUR NEGATIVE 05/23/2018 1737   KETONESUR NEGATIVE 05/23/2018 1737   PROTEINUR 100 (A) 05/23/2018 1737   NITRITE POSITIVE (A) 05/23/2018 1737   LEUKOCYTESUR LARGE (A) 05/23/2018 1737   Sepsis Labs: !!!!!!!!!!!!!!!!!!!!!!!!!!!!!!!!!!!!!!!!!!!! @LABRCNTIP (procalcitonin:4,lacticidven:4) )No results found for this or any previous visit (from the past 240 hour(s)).   Radiological Exams on Admission: Ct Abdomen Pelvis W Contrast  Result Date: 05/23/2018 CLINICAL DATA:  Right flank and abdominal pain. Recent treatment for urinary tract infection and renal stone. Prior cholecystectomy and hysterectomy. EXAM: CT ABDOMEN AND PELVIS WITH CONTRAST TECHNIQUE: Multidetector CT imaging of the abdomen and pelvis was performed using the standard protocol following bolus administration of intravenous contrast. CONTRAST:  ISOVUE-300 IOPAMIDOL (ISOVUE-300) INJECTION 61% COMPARISON:  None. FINDINGS: Lower chest: No significant pulmonary nodules or acute consolidative airspace disease. Hepatobiliary: Normal liver size. No liver mass. Small venovenous shunt in the right liver dome (series  2/image 14). Cholecystectomy. No biliary ductal dilatation. Pancreas: Normal, with no mass or duct dilation. Spleen: Normal size. No mass. Adrenals/Urinary Tract: Normal adrenals. Subtle patchy hypoperfusion in the interpolar right kidney. No right renal masses. Scattered subcentimeter hypodense renal cortical lesions in the upper left kidney are too small to characterize and require no follow-up. No perinephric collections. No hydronephrosis. Asymmetric mild urothelial wall thickening and hyperenhancement in the right renal collecting system and throughout the right ureter. Normal bladder.  Stomach/Bowel: Normal non-distended stomach. Normal caliber small bowel with no small bowel wall thickening. Normal appendix. Mild descending colonic diverticulosis. No large bowel wall thickening or acute pericolonic fat stranding. Vascular/Lymphatic: Normal caliber abdominal aorta. Patent portal, splenic, hepatic and renal veins. No pathologically enlarged lymph nodes in the abdomen or pelvis. Reproductive: Status post hysterectomy, with no abnormal findings at the vaginal cuff. No adnexal mass. Other: No pneumoperitoneum, ascites or focal fluid collection. Small superior fat containing periumbilical hernia. Musculoskeletal: No aggressive appearing focal osseous lesions. IMPRESSION: 1. Ascending right urinary tract infection with uncomplicated right pyelonephritis. No hydronephrosis. No perinephric fluid collections. No renal abscess. 2. Mild descending colonic diverticulosis. 3. Small superior periumbilical fat containing hernia. Electronically Signed   By: Delbert PhenixJason A Poff M.D.   On: 05/23/2018 19:29     All images have been reviewed by me personally.    Assessment/Plan Principal Problem:   Pyelonephritis of right kidney Active Problems:   Pyelonephritis   Cervical radiculopathy    Acute right-sided pyelonephritis - Admit patient to the hospital, she failed outpatient treatment course with 5 days of oral Macrobid - Appears to be uncomplicated without any evidence of hydronephrosis seen on the CAT scan. -Follow-up urine cultures, continue IV Rocephin -Pain control, IV fluids.  Supportive care  Hypokalemia - Potassium 2.8 at the time of admission, repleted.  Will repeat it now to ensure this is improved.  Cervical radiculopathy - We will continue her outpatient Zanaflex and Lyrica  DVT prophylaxis: Lovenox Code Status: Full code Family Communication: Husband at bedside Disposition Plan: To be determined Consults called: None Admission status: MedSurg admission   Time Spent: 65  minutes.  >50% of the time was devoted to discussing the patients care, assessment, plan and disposition with other care givers along with counseling the patient about the risks and benefits of treatment.    Carriann Hesse Joline Maxcyhirag Aloura Matsuoka MD [facility] Pager [phone]  If 7PM-7AM, please contact night-coverage [email or web address] Password [email or web address]  05/24/2018, 3:04 PM

## 2018-05-24 NOTE — Progress Notes (Signed)
BP=142/92. Patient is alert oriented x4. [facility] at 1445. Pain complained. Paged admission nurse and flow manager at the same time. Vital signs was taken. Waiting for orders.

## 2018-05-24 NOTE — ED Notes (Signed)
hospitalist paged regarding admission orders.

## 2018-05-25 ENCOUNTER — Inpatient Hospital Stay (HOSPITAL_COMMUNITY): Payer: BC Managed Care – PPO

## 2018-05-25 DIAGNOSIS — M5412 Radiculopathy, cervical region: Secondary | ICD-10-CM

## 2018-05-25 DIAGNOSIS — N12 Tubulo-interstitial nephritis, not specified as acute or chronic: Secondary | ICD-10-CM

## 2018-05-25 DIAGNOSIS — R945 Abnormal results of liver function studies: Secondary | ICD-10-CM

## 2018-05-25 DIAGNOSIS — E876 Hypokalemia: Secondary | ICD-10-CM

## 2018-05-25 LAB — CBC
HEMATOCRIT: 38.1 % (ref 36.0–46.0)
HEMOGLOBIN: 12.9 g/dL (ref 12.0–15.0)
MCH: 30.1 pg (ref 26.0–34.0)
MCHC: 33.9 g/dL (ref 30.0–36.0)
MCV: 88.8 fL (ref 80.0–100.0)
NRBC: 0 % (ref 0.0–0.2)
PLATELETS: 199 10*3/uL (ref 150–400)
RBC: 4.29 MIL/uL (ref 3.87–5.11)
RDW: 12.3 % (ref 11.5–15.5)
WBC: 5.5 10*3/uL (ref 4.0–10.5)

## 2018-05-25 LAB — URINE CULTURE

## 2018-05-25 LAB — COMPREHENSIVE METABOLIC PANEL
ALBUMIN: 3.4 g/dL — AB (ref 3.5–5.0)
ALT: 132 U/L — AB (ref 0–44)
AST: 65 U/L — ABNORMAL HIGH (ref 15–41)
Alkaline Phosphatase: 69 U/L (ref 38–126)
Anion gap: 9 (ref 5–15)
BUN: 11 mg/dL (ref 6–20)
CALCIUM: 8.5 mg/dL — AB (ref 8.9–10.3)
CHLORIDE: 107 mmol/L (ref 98–111)
CO2: 26 mmol/L (ref 22–32)
Creatinine, Ser: 0.64 mg/dL (ref 0.44–1.00)
GFR calc Af Amer: >60 mL/min (ref >60)
GLUCOSE: 103 mg/dL — AB (ref 70–99)
POTASSIUM: 3.5 mmol/L (ref 3.5–5.1)
Sodium: 142 mmol/L (ref 135–145)
Total Bilirubin: 0.4 mg/dL (ref 0.3–1.2)
Total Protein: 6.2 g/dL — ABNORMAL LOW (ref 6.5–8.1)

## 2018-05-25 LAB — HIV ANTIBODY (ROUTINE TESTING W REFLEX): HIV SCREEN 4TH GENERATION: NONREACTIVE

## 2018-05-25 LAB — MAGNESIUM: Magnesium: 1.8 mg/dL (ref 1.7–2.4)

## 2018-05-25 MED ORDER — TRAMADOL HCL 50 MG PO TABS
50.0000 mg | ORAL_TABLET | Freq: Four times a day (QID) | ORAL | Status: DC | PRN
  Administered 2018-05-25 – 2018-05-26 (×2): 50 mg via ORAL
  Filled 2018-05-25 (×3): qty 1

## 2018-05-25 NOTE — Progress Notes (Signed)
05/25/18  1000  EKG on chart

## 2018-05-25 NOTE — Progress Notes (Signed)
05/25/18  0940  Paged [facility] mid level to inform them that patient is c/o chest pain thru to shoulder blade.

## 2018-05-25 NOTE — Progress Notes (Signed)
05/25/18  1003  Notified midlevel [facility] that EKG is on patients chart. Still waiting for orders.

## 2018-05-25 NOTE — Progress Notes (Signed)
Patient ID: Tammy Benjamin, female   DOB: [birth year], 42 y.o.   MRN: [id number]  PROGRESS NOTE    Kisha Messman  WUJ:[id number] DOB: 1976/03/27 DOA: 05/23/2018 PCP: Maia Plan, MD   Brief Narrative:  42 year old female with history of cervical radiculopathy presented for evaluation of right-sided flank pain not improving despite treatment with 5 days of oral Macrobid.  She was found to have right-sided pyonephritis on CT scan.  She was started on IV antibiotics and IV analgesics.   Assessment & Plan:   Principal Problem:   Pyelonephritis of right kidney Active Problems:   Pyelonephritis   Cervical radiculopathy   Acute right-sided pyelonephritis -With failed outpatient treatment with 5-day course of oral Macrobid -Still having constant right-sided flank pain and requiring IV Toradol.  Will add oral tramadol as needed for moderate pain -Continues to have intermittent nausea but no vomiting -Continue IV Rocephin.  Follow urine culture  Elevated LFTs -Questionable cause.  Might be related to infection.  Check hepatitis panel in a.m.  Repeat a.m. LFTs.  Check right upper quadrant ultrasound.  Patient has history of cholecystectomy  Hypokalemia -Improved.  Discontinue cardiac monitor  Cervical radiculopathy -Continue Zanaflex and Lyrica.  Outpatient follow-up   DVT prophylaxis: Lovenox Code Status: Full Family Communication: None at bedside Disposition Plan: Home in 1 to 2 days if clinically improves  Consultants: None  Procedures: None  Antimicrobials: Rocephin from 05/24/2018 onwards   Subjective: Patient seen and examined at bedside.  Still complains of constant right-sided flank pain with intermittent nausea.  No overnight fever or vomiting.  Objective: Vitals:   05/24/18 1430 05/24/18 1936 05/24/18 1938 05/25/18 0450  BP: 130/80 (!) 163/123 (!) 148/98 104/63  Pulse:  79 78 65  Resp:  18  20  Temp:  98.8 F (37.1 C)  97.9 F (36.6 C)  TempSrc:  Oral  Oral    SpO2:  100%  96%  Weight:      Height:        Intake/Output Summary (Last 24 hours) at 05/25/2018 1054 Last data filed at 05/24/2018 1700 Gross per 24 hour  Intake 196.47 ml  Output -  Net 196.47 ml   Filed Weights   05/23/18 1729  Weight: 86.2 kg    Examination:  General exam: Appears calm and comfortable  Respiratory system: Bilateral decreased breath sounds at bases Cardiovascular system: S1 & S2 heard, Rate controlled Gastrointestinal system: Abdomen is nondistended, soft and mildly tender in the right upper quadrant. Normal bowel sounds heard.  No rebound tenderness.  Right CVA tenderness present Extremities: No cyanosis, edema    Data Reviewed: I have personally reviewed following labs and imaging studies  CBC: Recent Labs  Lab 05/23/18 1807 05/24/18 1609 05/25/18 0621  WBC 10.1 4.9 5.5  NEUTROABS 7.1  --   --   HGB 13.4 13.1 12.9  HCT 39.5 39.5 38.1  MCV 87.8 90.6 88.8  PLT 223 205 199   Basic Metabolic Panel: Recent Labs  Lab 05/23/18 1807 05/24/18 1609 05/25/18 0621  NA 140 144 142  K 2.8* 3.5 3.5  CL 103 108 107  CO2 28 32 26  GLUCOSE 92 97 103*  BUN 15 10 11   CREATININE 0.81 0.74 0.64  CALCIUM 9.2 8.5* 8.5*  MG  --  2.1 1.8   GFR: Estimated Creatinine Clearance: 88.5 mL/min (by C-G formula based on SCr of 0.64 mg/dL). Liver Function Tests: Recent Labs  Lab 05/23/18 1807 05/25/18 0621  AST 15 65*  ALT  14 132*  ALKPHOS 54 69  BILITOT 0.5 0.4  PROT 6.8 6.2*  ALBUMIN 4.0 3.4*   Recent Labs  Lab 05/23/18 1807  LIPASE 41   No results for input(s): AMMONIA in the last 168 hours. Coagulation Profile: No results for input(s): INR, PROTIME in the last 168 hours. Cardiac Enzymes: No results for input(s): CKTOTAL, CKMB, CKMBINDEX, TROPONINI in the last 168 hours. BNP (last 3 results) No results for input(s): PROBNP in the last 8760 hours. HbA1C: No results for input(s): HGBA1C in the last 72 hours. CBG: No results for input(s):  GLUCAP in the last 168 hours. Lipid Profile: No results for input(s): CHOL, HDL, LDLCALC, TRIG, CHOLHDL, LDLDIRECT in the last 72 hours. Thyroid Function Tests: No results for input(s): TSH, T4TOTAL, FREET4, T3FREE, THYROIDAB in the last 72 hours. Anemia Panel: No results for input(s): VITAMINB12, FOLATE, FERRITIN, TIBC, IRON, RETICCTPCT in the last 72 hours. Sepsis Labs: No results for input(s): PROCALCITON, LATICACIDVEN in the last 168 hours.  Recent Results (from the past 240 hour(s))  Urine culture     Status: Abnormal   Collection Time: 05/23/18  6:07 PM  Result Value Ref Range Status   Specimen Description   Final    URINE, RANDOM Performed at [facility], [address] [place], [place] [address]    Special Requests   Final    NONE Performed at [facility], [address] [place], [place] [address]    Culture MULTIPLE SPECIES PRESENT, SUGGEST RECOLLECTION (A)  Final   Report Status 05/25/2018 FINAL  Final         Radiology Studies: Ct Abdomen Pelvis W Contrast  Result Date: 05/23/2018 CLINICAL DATA:  Right flank and abdominal pain. Recent treatment for urinary tract infection and renal stone. Prior cholecystectomy and hysterectomy. EXAM: CT ABDOMEN AND PELVIS WITH CONTRAST TECHNIQUE: Multidetector CT imaging of the abdomen and pelvis was performed using the standard protocol following bolus administration of intravenous contrast. CONTRAST:  ISOVUE-300 IOPAMIDOL (ISOVUE-300) INJECTION 61% COMPARISON:  None. FINDINGS: Lower chest: No significant pulmonary nodules or acute consolidative airspace disease. Hepatobiliary: Normal liver size. No liver mass. Small venovenous shunt in the right liver dome (series 2/image 14). Cholecystectomy. No biliary ductal dilatation. Pancreas: Normal, with no mass or duct dilation. Spleen: Normal size. No mass. Adrenals/Urinary Tract: Normal adrenals. Subtle patchy hypoperfusion in the interpolar right kidney. No  right renal masses. Scattered subcentimeter hypodense renal cortical lesions in the upper left kidney are too small to characterize and require no follow-up. No perinephric collections. No hydronephrosis. Asymmetric mild urothelial wall thickening and hyperenhancement in the right renal collecting system and throughout the right ureter. Normal bladder. Stomach/Bowel: Normal non-distended stomach. Normal caliber small bowel with no small bowel wall thickening. Normal appendix. Mild descending colonic diverticulosis. No large bowel wall thickening or acute pericolonic fat stranding. Vascular/Lymphatic: Normal caliber abdominal aorta. Patent portal, splenic, hepatic and renal veins. No pathologically enlarged lymph nodes in the abdomen or pelvis. Reproductive: Status post hysterectomy, with no abnormal findings at the vaginal cuff. No adnexal mass. Other: No pneumoperitoneum, ascites or focal fluid collection. Small superior fat containing periumbilical hernia. Musculoskeletal: No aggressive appearing focal osseous lesions. IMPRESSION: 1. Ascending right urinary tract infection with uncomplicated right pyelonephritis. No hydronephrosis. No perinephric fluid collections. No renal abscess. 2. Mild descending colonic diverticulosis. 3. Small superior periumbilical fat containing hernia. Electronically Signed   By: Delbert Phenix M.D.   On: 05/23/2018 19:29   US Abdomen  Limited Ruq  Result Date: 05/25/2018 CLINICAL DATA:  Abnormal liver function tests. EXAM: ULTRASOUND ABDOMEN LIMITED RIGHT UPPER QUADRANT COMPARISON:  None. FINDINGS: Gallbladder: Previous cholecystectomy. Common bile duct: Diameter: 6 mm.  Normal. Liver: No focal lesion identified. Within normal limits in parenchymal echogenicity. Portal vein is patent on color Doppler imaging with normal direction of blood flow towards the liver. IMPRESSION: Previous cholecystectomy. Otherwise normal exam. No abnormality seen to explain the laboratory abnormalities.  Electronically Signed   By: Paulina FusiMark  Shogry M.D.   On: 05/25/2018 09:31        Scheduled Meds: . enoxaparin (LOVENOX) injection  40 mg Subcutaneous Q24H  . pregabalin  50 mg Oral Daily   Continuous Infusions: . sodium chloride 100 mL/hr at 05/24/18 1700  . cefTRIAXone (ROCEPHIN)  IV Stopped (05/24/18 1604)     LOS: 1 day        Glade LloydKshitiz Aviannah Castoro, MD [facility] Pager [phone]  If 7PM-7AM, please contact night-coverage [email or web address] Password [email or web address] 05/25/2018, 10:54 AM

## 2018-05-26 LAB — CBC WITH DIFFERENTIAL/PLATELET
ABS IMMATURE GRANULOCYTES: 0.03 10*3/uL (ref 0.00–0.07)
Basophils Absolute: 0 10*3/uL (ref 0.0–0.1)
Basophils Relative: 0 %
Eosinophils Absolute: 0.1 10*3/uL (ref 0.0–0.5)
Eosinophils Relative: 2 %
HCT: 39.1 % (ref 36.0–46.0)
HEMOGLOBIN: 12.8 g/dL (ref 12.0–15.0)
Immature Granulocytes: 0 %
LYMPHS ABS: 1.6 10*3/uL (ref 0.7–4.0)
LYMPHS PCT: 23 %
MCH: 29.4 pg (ref 26.0–34.0)
MCHC: 32.7 g/dL (ref 30.0–36.0)
MCV: 89.7 fL (ref 80.0–100.0)
MONO ABS: 0.7 10*3/uL (ref 0.1–1.0)
MONOS PCT: 10 %
NEUTROS ABS: 4.7 10*3/uL (ref 1.7–7.7)
Neutrophils Relative %: 65 %
Platelets: 231 10*3/uL (ref 150–400)
RBC: 4.36 MIL/uL (ref 3.87–5.11)
RDW: 12.1 % (ref 11.5–15.5)
WBC: 7.2 10*3/uL (ref 4.0–10.5)
nRBC: 0 % (ref 0.0–0.2)

## 2018-05-26 LAB — COMPREHENSIVE METABOLIC PANEL
ALK PHOS: 69 U/L (ref 38–126)
ALT: 99 U/L — AB (ref 0–44)
AST: 32 U/L (ref 15–41)
Albumin: 3.5 g/dL (ref 3.5–5.0)
Anion gap: 7 (ref 5–15)
BUN: 14 mg/dL (ref 6–20)
CALCIUM: 9.2 mg/dL (ref 8.9–10.3)
CO2: 29 mmol/L (ref 22–32)
CREATININE: 0.79 mg/dL (ref 0.44–1.00)
Chloride: 105 mmol/L (ref 98–111)
GFR calc non Af Amer: >60 mL/min (ref >60)
GLUCOSE: 104 mg/dL — AB (ref 70–99)
Potassium: 4.2 mmol/L (ref 3.5–5.1)
SODIUM: 141 mmol/L (ref 135–145)
Total Bilirubin: 0.4 mg/dL (ref 0.3–1.2)
Total Protein: 6.4 g/dL — ABNORMAL LOW (ref 6.5–8.1)

## 2018-05-26 LAB — MAGNESIUM: Magnesium: 1.9 mg/dL (ref 1.7–2.4)

## 2018-05-26 MED ORDER — TRAMADOL HCL 50 MG PO TABS: 50.0000 mg | ORAL_TABLET | Freq: Four times a day (QID) | ORAL | 0 refills | Status: DC | PRN

## 2018-05-26 MED ORDER — ONDANSETRON HCL 4 MG PO TABS: 4.0000 mg | ORAL_TABLET | Freq: Four times a day (QID) | ORAL | 0 refills | Status: AC | PRN

## 2018-05-26 MED ORDER — CEPHALEXIN 500 MG PO CAPS: 500.0000 mg | ORAL_CAPSULE | Freq: Three times a day (TID) | ORAL | 0 refills | Status: AC

## 2018-05-26 NOTE — Discharge Summary (Signed)
Physician Discharge Summary  Tammy Benjamin ZOX:[id number] DOB: 06-11-1976 DOA: 05/23/2018  PCP: Maia Plan, MD  Admit date: 05/23/2018 Discharge date: 05/26/2018  Admitted From: Home Disposition: Home  Recommendations for Outpatient Follow-up:  1. Follow up with PCP in 1 week   Home Health: No Equipment/Devices: None Discharge Condition: Stable CODE STATUS: Full Diet recommendation: Regular  Brief/Interim Summary: 42 year old female with history of cervical radiculopathy presented for evaluation of right-sided flank pain not improving despite treatment with 5 days of oral Macrobid.  She was found to have right-sided pyonephritis on CT scan.  She was started on IV antibiotics and IV analgesics.  Urine culture grew multiple species.  She is tolerating diet and is feeling better.  She will be discharged on oral antibiotics.  Discharge Diagnoses:  Principal Problem:   Pyelonephritis of right kidney Active Problems:   Pyelonephritis   Cervical radiculopathy  Acute right-sided pyelonephritis -With failed outpatient treatment with 5-day course of oral Macrobid -Required IV analgesics. -Improving.  Currently on Rocephin. -Urine culture grew multiple species.  Afebrile with no elevated white count.  Tolerating diet.  Will discharge on oral Keflex for 7 days.  Outpatient follow-up with PCP   elevated LFTs -Questionable cause.  Might be related to infection.   -Improving.  No evidence of CBD stone on ultrasound.  Has history of cholecystectomy in the past  Hypokalemia -Improved.   Cervical radiculopathy -Continue Zanaflex and Lyrica.  Outpatient follow-up  Discharge Instructions  Discharge Instructions    Call MD for:  difficulty breathing, headache or visual disturbances   Complete by:  As directed    Call MD for:  extreme fatigue   Complete by:  As directed    Call MD for:  hives   Complete by:  As directed    Call MD for:  persistant dizziness or light-headedness    Complete by:  As directed    Call MD for:  persistant nausea and vomiting   Complete by:  As directed    Call MD for:  severe uncontrolled pain   Complete by:  As directed    Call MD for:  temperature >100.4   Complete by:  As directed    Diet general   Complete by:  As directed    Increase activity slowly   Complete by:  As directed      Allergies as of 05/26/2018      Reactions   Bee Venom Shortness Of Breath   Hydrocodone Anaphylaxis   Pt tolerates Tramadol   Penicillins Rash   Has patient had a PCN reaction causing immediate rash, facial/tongue/throat swelling, SOB or lightheadedness with hypotension: Yes Has patient had a PCN reaction causing severe rash involving mucus membranes or skin necrosis: No Has patient had a PCN reaction that required hospitalization: No Has patient had a PCN reaction occurring within the last 10 years: No If all of the above answers are "NO", then may proceed with Cephalosporin use.      Medication List    STOP taking these medications   cyclobenzaprine 10 MG tablet Commonly known as:  FLEXERIL   gabapentin 100 MG capsule Commonly known as:  NEURONTIN   lidocaine 5 % Commonly known as:  LIDODERM     TAKE these medications   celecoxib 100 MG capsule Commonly known as:  CELEBREX Take 100 mg by mouth 2 (two) times daily.   cephALEXin 500 MG capsule Commonly known as:  KEFLEX Take 1 capsule (500 mg total) by mouth 3 (  three) times daily for 7 days.   diphenhydramine-acetaminophen 25-500 MG Tabs tablet Commonly known as:  TYLENOL PM Take 2 tablets by mouth at bedtime as needed (sleep).   escitalopram 10 MG tablet Commonly known as:  LEXAPRO Take 10 mg by mouth daily.   ondansetron 4 MG tablet Commonly known as:  ZOFRAN Take 1 tablet (4 mg total) by mouth every 6 (six) hours as needed for nausea or vomiting.   pregabalin 50 MG capsule Commonly known as:  LYRICA Take 50 mg by mouth 3 (three) times daily.   tiZANidine 4 MG  tablet Commonly known as:  ZANAFLEX Take 4 mg by mouth every 6 (six) hours as needed for muscle spasms. What changed:  Another medication with the same name was removed. Continue taking this medication, and follow the directions you see here.   traMADol 50 MG tablet Commonly known as:  ULTRAM Take 1 tablet (50 mg total) by mouth every 6 (six) hours as needed for moderate pain.   VYVANSE 70 MG capsule Generic drug:  lisdexamfetamine Take 70 mg by mouth daily as needed (focus).      Follow-up Information    Maia Plan, MD Follow up.   Specialty:  Internal Medicine Contact information: [address] [place] [address] [phone]          Allergies  Allergen Reactions  . Bee Venom Shortness Of Breath  . Hydrocodone Anaphylaxis    Pt tolerates Tramadol  . Penicillins Rash    Has patient had a PCN reaction causing immediate rash, facial/tongue/throat swelling, SOB or lightheadedness with hypotension: Yes Has patient had a PCN reaction causing severe rash involving mucus membranes or skin necrosis: No Has patient had a PCN reaction that required hospitalization: No Has patient had a PCN reaction occurring within the last 10 years: No If all of the above answers are "NO", then may proceed with Cephalosporin use.    Consultations:  None   Procedures/Studies: Ct Abdomen Pelvis W Contrast  Result Date: 05/23/2018 CLINICAL DATA:  Right flank and abdominal pain. Recent treatment for urinary tract infection and renal stone. Prior cholecystectomy and hysterectomy. EXAM: CT ABDOMEN AND PELVIS WITH CONTRAST TECHNIQUE: Multidetector CT imaging of the abdomen and pelvis was performed using the standard protocol following bolus administration of intravenous contrast. CONTRAST:  ISOVUE-300 IOPAMIDOL (ISOVUE-300) INJECTION 61% COMPARISON:  None. FINDINGS: Lower chest: No significant pulmonary nodules or acute consolidative airspace disease. Hepatobiliary: Normal  liver size. No liver mass. Small venovenous shunt in the right liver dome (series 2/image 14). Cholecystectomy. No biliary ductal dilatation. Pancreas: Normal, with no mass or duct dilation. Spleen: Normal size. No mass. Adrenals/Urinary Tract: Normal adrenals. Subtle patchy hypoperfusion in the interpolar right kidney. No right renal masses. Scattered subcentimeter hypodense renal cortical lesions in the upper left kidney are too small to characterize and require no follow-up. No perinephric collections. No hydronephrosis. Asymmetric mild urothelial wall thickening and hyperenhancement in the right renal collecting system and throughout the right ureter. Normal bladder. Stomach/Bowel: Normal non-distended stomach. Normal caliber small bowel with no small bowel wall thickening. Normal appendix. Mild descending colonic diverticulosis. No large bowel wall thickening or acute pericolonic fat stranding. Vascular/Lymphatic: Normal caliber abdominal aorta. Patent portal, splenic, hepatic and renal veins. No pathologically enlarged lymph nodes in the abdomen or pelvis. Reproductive: Status post hysterectomy, with no abnormal findings at the vaginal cuff. No adnexal mass. Other: No pneumoperitoneum, ascites or focal fluid collection. Small superior fat containing periumbilical hernia.  Musculoskeletal: No aggressive appearing focal osseous lesions. IMPRESSION: 1. Ascending right urinary tract infection with uncomplicated right pyelonephritis. No hydronephrosis. No perinephric fluid collections. No renal abscess. 2. Mild descending colonic diverticulosis. 3. Small superior periumbilical fat containing hernia. Electronically Signed   By: Delbert Phenix M.D.   On: 05/23/2018 19:29   US Abdomen Limited Ruq  Result Date: 05/25/2018 CLINICAL DATA:  Abnormal liver function tests. EXAM: ULTRASOUND ABDOMEN LIMITED RIGHT UPPER QUADRANT COMPARISON:  None. FINDINGS: Gallbladder: Previous cholecystectomy. Common bile duct: Diameter: 6  mm.  Normal. Liver: No focal lesion identified. Within normal limits in parenchymal echogenicity. Portal vein is patent on color Doppler imaging with normal direction of blood flow towards the liver. IMPRESSION: Previous cholecystectomy. Otherwise normal exam. No abnormality seen to explain the laboratory abnormalities. Electronically Signed   By: Paulina Fusi M.D.   On: 05/25/2018 09:31      Subjective: Patient seen and examined at bedside.  No overnight fever or vomiting.  Complains of intermittent nausea and headache.  Feels better enough and wants to go home.  Discharge Exam: Vitals:   05/25/18 1814 05/26/18 0557  BP:  125/81  Pulse:  60  Resp:  19  Temp:  (!) 97.5 F (36.4 C)  SpO2: 97% 99%   Vitals:   05/25/18 0450 05/25/18 1436 05/25/18 1814 05/26/18 0557  BP: 104/63 122/73  125/81  Pulse: 65 (!) 57  60  Resp: 20 17  19   Temp: 97.9 F (36.6 C) (!) 97.4 F (36.3 C)  (!) 97.5 F (36.4 C)  TempSrc: Oral Oral  Oral  SpO2: 96% 99% 97% 99%  Weight:      Height:        General: Pt is alert, awake, not in acute distress Cardiovascular: rate controlled, S1/S2 + Respiratory: bilateral decreased breath sounds at bases Abdominal: Soft, NT, ND, bowel sounds +, mild right CVA tenderness Extremities: no edema, no cyanosis    The results of significant diagnostics from this hospitalization (including imaging, microbiology, ancillary and laboratory) are listed below for reference.     Microbiology: Recent Results (from the past 240 hour(s))  Urine culture     Status: Abnormal   Collection Time: 05/23/18  6:07 PM  Result Value Ref Range Status   Specimen Description   Final    URINE, RANDOM Performed at [facility], [address] [place], [place] [address]    Special Requests   Final    NONE Performed at [facility], [address] [place], [place] [address]    Culture MULTIPLE SPECIES PRESENT, SUGGEST RECOLLECTION (A)  Final   Report Status  05/25/2018 FINAL  Final     Labs: BNP (last 3 results) No results for input(s): BNP in the last 8760 hours. Basic Metabolic Panel: Recent Labs  Lab 05/23/18 1807 05/24/18 1609 05/25/18 0621 05/26/18 0619  NA 140 144 142 141  K 2.8* 3.5 3.5 4.2  CL 103 108 107 105  CO2 28 32 26 29  GLUCOSE 92 97 103* 104*  BUN 15 10 11 14   CREATININE 0.81 0.74 0.64 0.79  CALCIUM 9.2 8.5* 8.5* 9.2  MG  --  2.1 1.8 1.9   Liver Function Tests: Recent Labs  Lab 05/23/18 1807 05/25/18 0621 05/26/18 0619  AST 15 65* 32  ALT 14 132* 99*  ALKPHOS 54 69 69  BILITOT 0.5 0.4 0.4  PROT 6.8 6.2* 6.4*  ALBUMIN 4.0 3.4* 3.5   Recent Labs  Lab 05/23/18 1807  LIPASE 41   No results for input(s): AMMONIA in the last 168 hours. CBC: Recent Labs  Lab 05/23/18 1807 05/24/18 1609 05/25/18 0621 05/26/18 0619  WBC 10.1 4.9 5.5 7.2  NEUTROABS 7.1  --   --  4.7  HGB 13.4 13.1 12.9 12.8  HCT 39.5 39.5 38.1 39.1  MCV 87.8 90.6 88.8 89.7  PLT 223 205 199 231   Cardiac Enzymes: No results for input(s): CKTOTAL, CKMB, CKMBINDEX, TROPONINI in the last 168 hours. BNP: Invalid input(s): POCBNP CBG: No results for input(s): GLUCAP in the last 168 hours. D-Dimer No results for input(s): DDIMER in the last 72 hours. Hgb A1c No results for input(s): HGBA1C in the last 72 hours. Lipid Profile No results for input(s): CHOL, HDL, LDLCALC, TRIG, CHOLHDL, LDLDIRECT in the last 72 hours. Thyroid function studies No results for input(s): TSH, T4TOTAL, T3FREE, THYROIDAB in the last 72 hours.  Invalid input(s): FREET3 Anemia work up No results for input(s): VITAMINB12, FOLATE, FERRITIN, TIBC, IRON, RETICCTPCT in the last 72 hours. Urinalysis    Component Value Date/Time   COLORURINE YELLOW 05/23/2018 1737   APPEARANCEUR CLOUDY (A) 05/23/2018 1737   LABSPEC 1.010 05/23/2018 1737   PHURINE 6.5 05/23/2018 1737   GLUCOSEU 100 (A) 05/23/2018 1737   HGBUR MODERATE (A) 05/23/2018 1737   BILIRUBINUR  NEGATIVE 05/23/2018 1737   KETONESUR NEGATIVE 05/23/2018 1737   PROTEINUR 100 (A) 05/23/2018 1737   NITRITE POSITIVE (A) 05/23/2018 1737   LEUKOCYTESUR LARGE (A) 05/23/2018 1737   Sepsis Labs Invalid input(s): PROCALCITONIN,  WBC,  LACTICIDVEN Microbiology Recent Results (from the past 240 hour(s))  Urine culture     Status: Abnormal   Collection Time: 05/23/18  6:07 PM  Result Value Ref Range Status   Specimen Description   Final    URINE, RANDOM Performed at [facility] [place] [facility], [address] [place], [place] [address]    Special Requests   Final    NONE Performed at [facility], [address] [place], [place] [address]    Culture MULTIPLE SPECIES PRESENT, SUGGEST RECOLLECTION (A)  Final   Report Status 05/25/2018 FINAL  Final     Time coordinating discharge: 35 minutes  SIGNED:   Glade LloydKshitiz Mekisha Bittel, MD  [facility] 05/26/2018, 8:21 AM Pager: [phone]  If 7PM-7AM, please contact night-coverage [email or web address] Password [email or web address]

## 2018-05-26 NOTE — Care Management Note (Signed)
Case Management Note  Patient Details  Name: Tammy Benjamin MRN: [id number] Date of Birth: 1975/07/08  Subjective/Objective:                  discharged  Action/Plan: Discharged to home with self-care, orders checked for hhc needs. No CM needs present at time of discharge.  Expected Discharge Date:  05/26/18               Expected Discharge Plan:  Home/Self Care  In-House Referral:     Discharge planning Services  CM Consult  Post Acute Care Choice:    Choice offered to:     DME Arranged:    DME Agency:     HH Arranged:    HH Agency:     Status of Service:  Completed, signed off  If discussed at [employer] Length of Stay Meetings, dates discussed:    Additional Comments:  Golda AcreDavis, Myeasha Ballowe Lynn, RN 05/26/2018, 8:45 AM

## 2018-05-27 LAB — HEPATITIS PANEL, ACUTE
HCV Ab: <0.1 s/co ratio (ref 0.0–0.9)
Hep A IgM: NEGATIVE
Hep B C IgM: NEGATIVE
Hepatitis B Surface Ag: NEGATIVE

## 2020-05-10 IMAGING — CT CT ABD-PELV W/ CM
2 of 5 series · 16 of 46 positions shown, 18 images · IV contrast (APPLIED)
Comparison: None.

CLINICAL DATA: Right flank and abdominal pain. Recent treatment for
urinary tract infection and renal stone. Prior cholecystectomy and
hysterectomy.

EXAM:
CT ABDOMEN AND PELVIS WITH CONTRAST
TECHNIQUE: Multidetector CT imaging of the abdomen and pelvis was performed
using the standard protocol following bolus administration of
intravenous contrast.
CONTRAST:  100mL PU6X04-IKK IOPAMIDOL (PU6X04-IKK) INJECTION 61%

[Series 2: axial st · axial · 0.86mm/px · z∈[+729,+1169]mm · 13 of 100 slices shown, 15 images]
[im 6/100  soft-tissue]
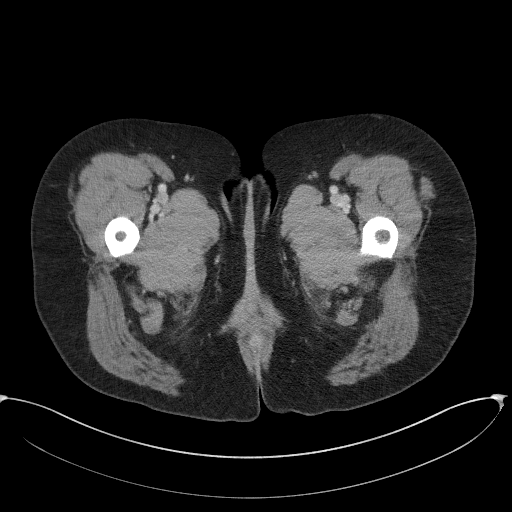
[im 6/100  bone]
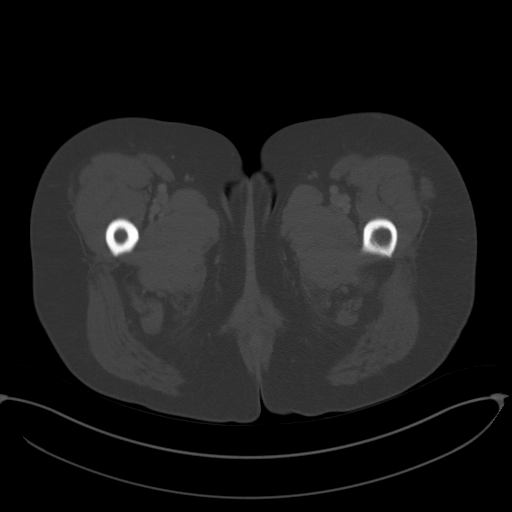
[im 12/100  soft-tissue]
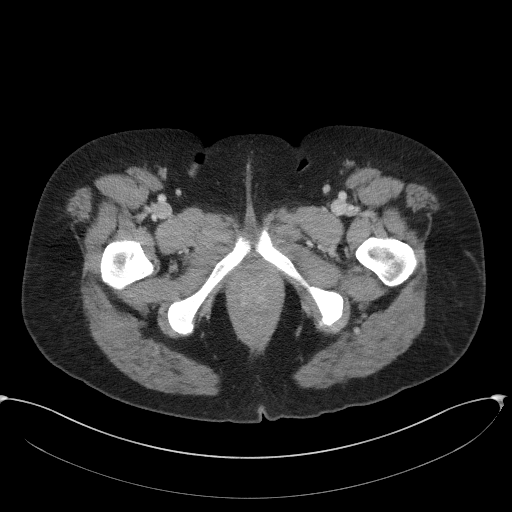
[im 24/100  soft-tissue]
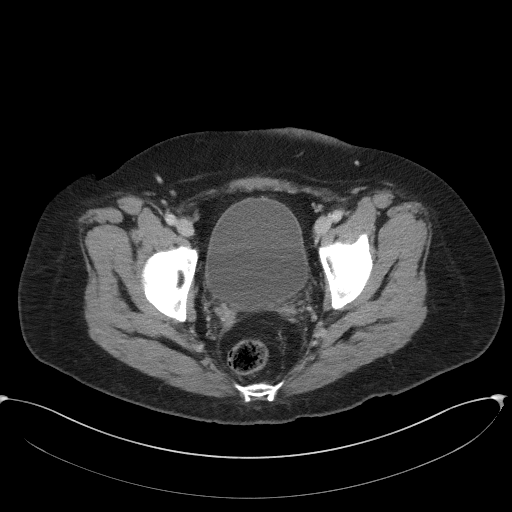
[im 30/100  soft-tissue]
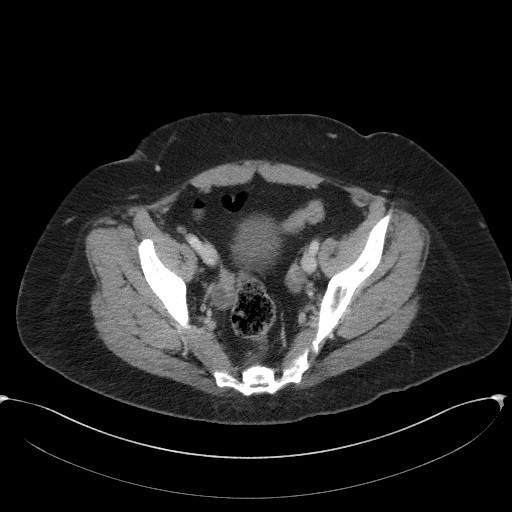
[im 35/100  soft-tissue]
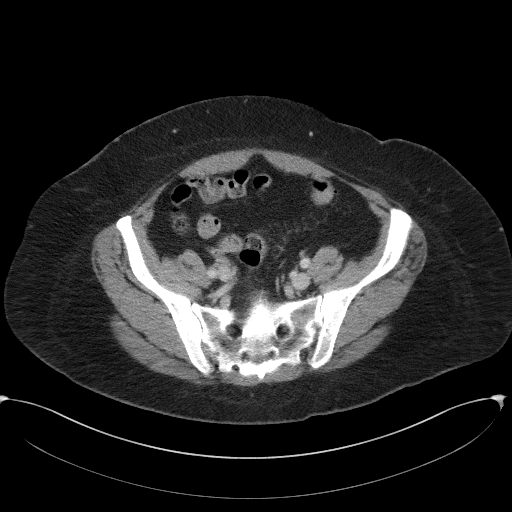
[im 41/100  soft-tissue]
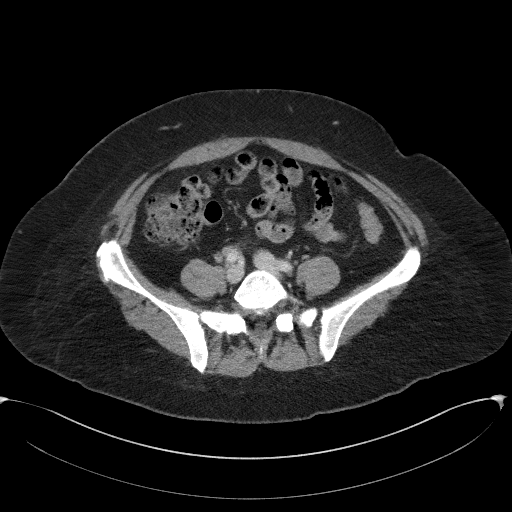
[im 53/100  soft-tissue]
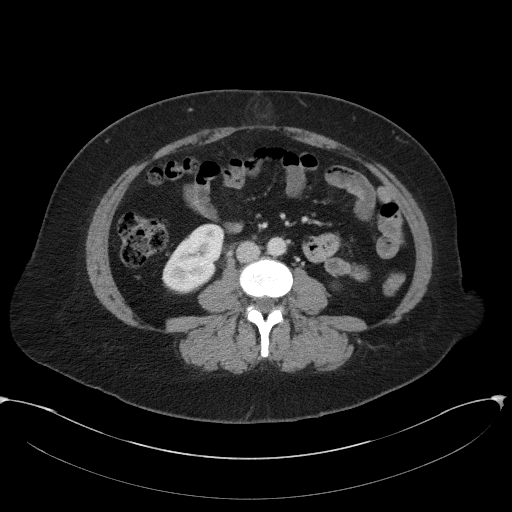
[im 59/100  soft-tissue]
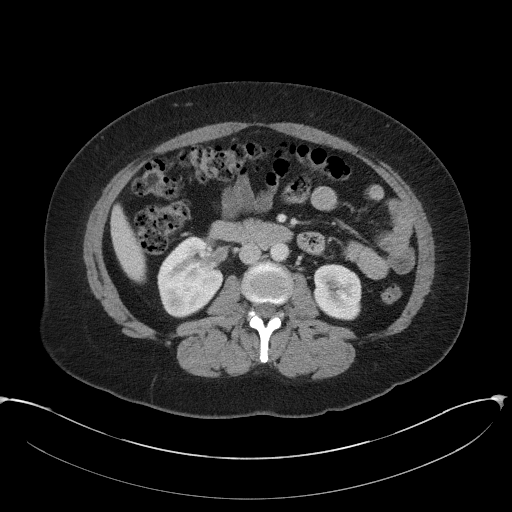
[im 65/100  soft-tissue]
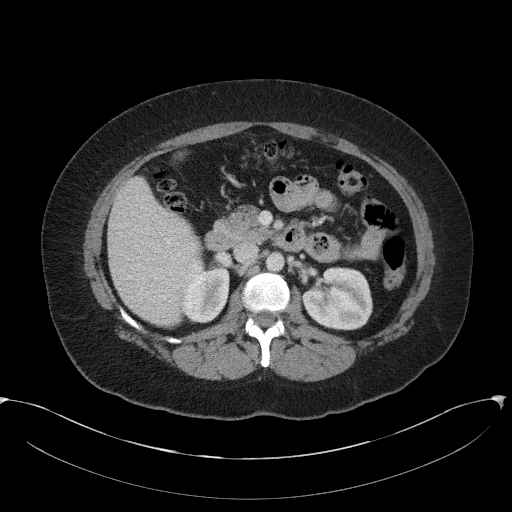
[im 65/100  bone]
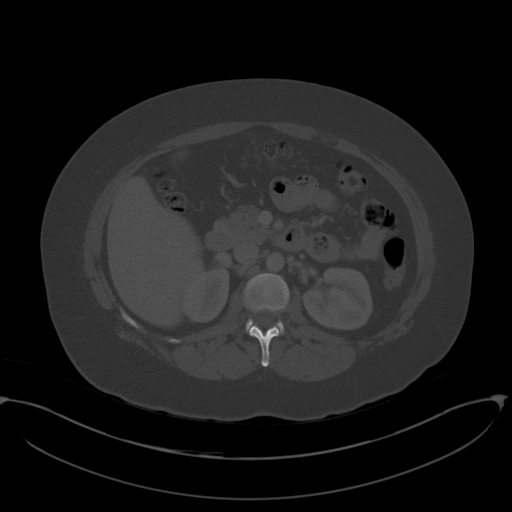
[im 70/100  soft-tissue]
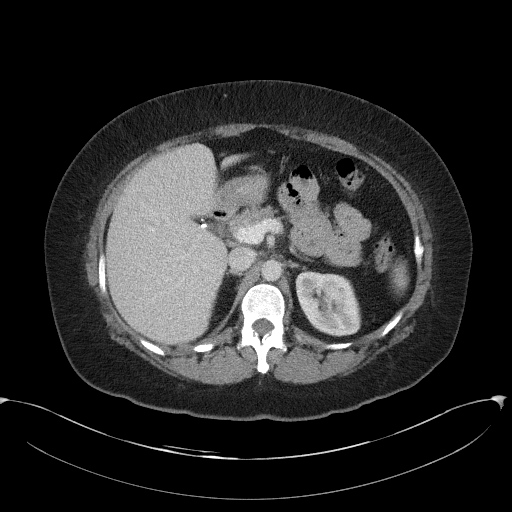
[im 76/100  soft-tissue]
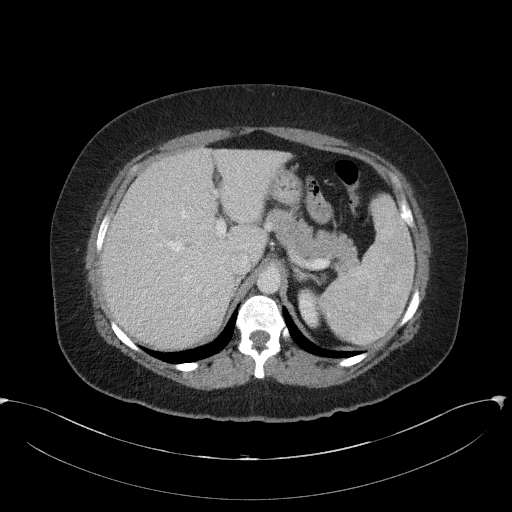
[im 88/100  soft-tissue]
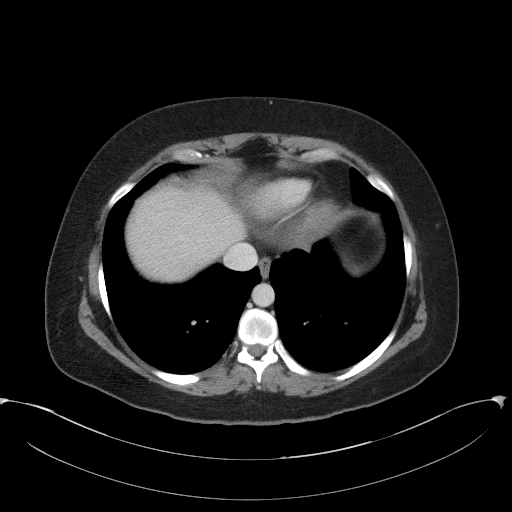
[im 94/100  soft-tissue]
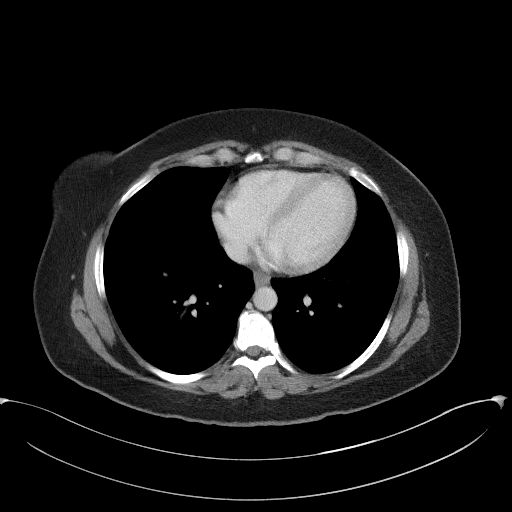

[Series 5: coronal st · coronal · 0.82mm/px · 3 of 100 slices shown]
[im 34/100  soft-tissue]
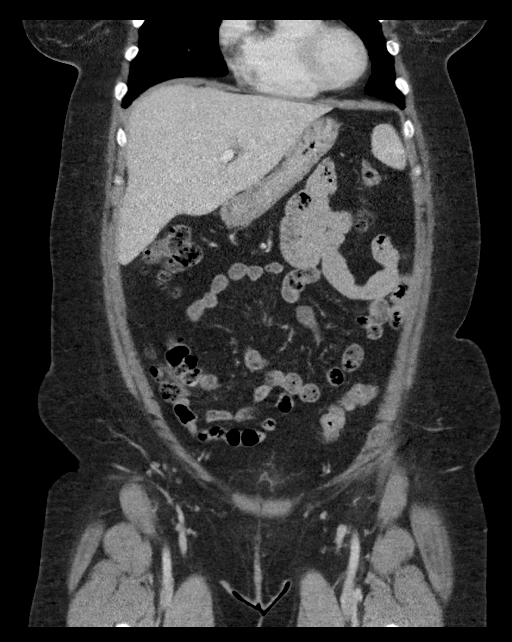
[im 45/100  soft-tissue]
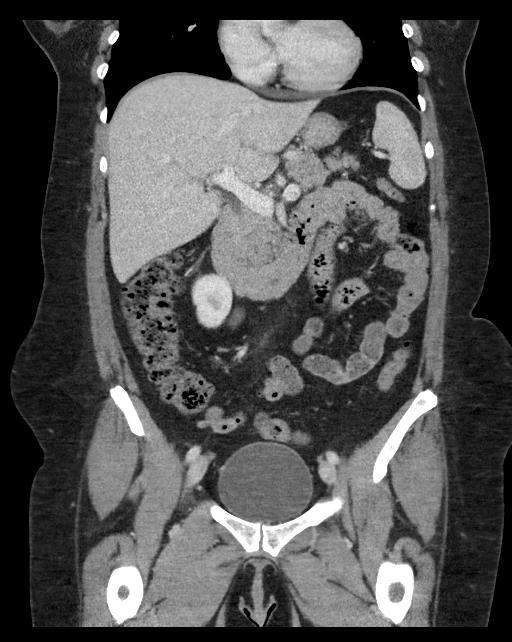
[im 56/100  soft-tissue]
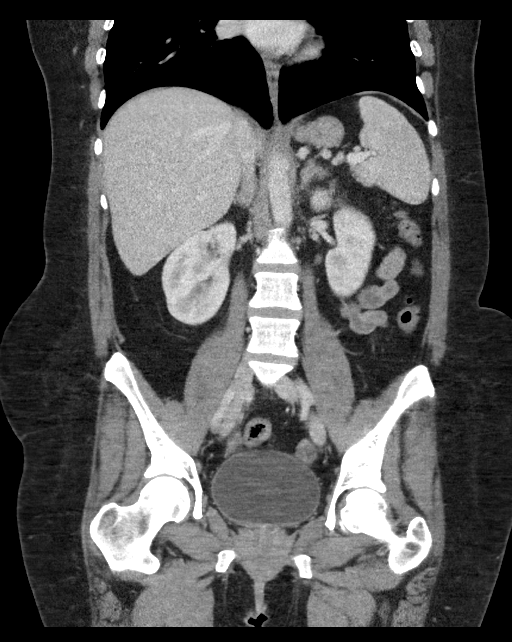

[16 of 46 positions shown; findings below may reference images not displayed]

FINDINGS: Lower chest: No significant pulmonary nodules or acute consolidative
airspace disease.

Hepatobiliary: Normal liver size. No liver mass. Small venovenous
shunt in the right liver dome (series 2/image 14). Cholecystectomy.
No biliary ductal dilatation.

Pancreas: Normal, with no mass or duct dilation.

Spleen: Normal size. No mass.

Adrenals/Urinary Tract: Normal adrenals. Subtle patchy hypoperfusion
in the interpolar right kidney. No right renal masses. Scattered
subcentimeter hypodense renal cortical lesions in the upper left
kidney are too small to characterize and require no follow-up. No
perinephric collections. No hydronephrosis. Asymmetric mild
urothelial wall thickening and hyperenhancement in the right renal
collecting system and throughout the right ureter. Normal bladder.

Stomach/Bowel: Normal non-distended stomach. Normal caliber small
bowel with no small bowel wall thickening. Normal appendix. Mild
descending colonic diverticulosis. No large bowel wall thickening or
acute pericolonic fat stranding.

Vascular/Lymphatic: Normal caliber abdominal aorta. Patent portal,
splenic, hepatic and renal veins. No pathologically enlarged lymph
nodes in the abdomen or pelvis.

Reproductive: Status post hysterectomy, with no abnormal findings at
the vaginal cuff. No adnexal mass.

Other: No pneumoperitoneum, ascites or focal fluid collection. Small
superior fat containing periumbilical hernia.

Musculoskeletal: No aggressive appearing focal osseous lesions.
IMPRESSION: 1. Ascending right urinary tract infection with uncomplicated right
pyelonephritis. No hydronephrosis. No perinephric fluid collections.
No renal abscess.
2. Mild descending colonic diverticulosis.
3. Small superior periumbilical fat containing hernia.

## 2020-05-12 IMAGING — US US ABDOMEN LIMITED
1 series · 14 of 25 positions shown · non-contrast
Comparison: None.

CLINICAL DATA: Abnormal liver function tests.

EXAM:
ULTRASOUND ABDOMEN LIMITED RIGHT UPPER QUADRANT

[Series 1: us abdomen limited · 14 of 31 slices shown]
[im 1/31]
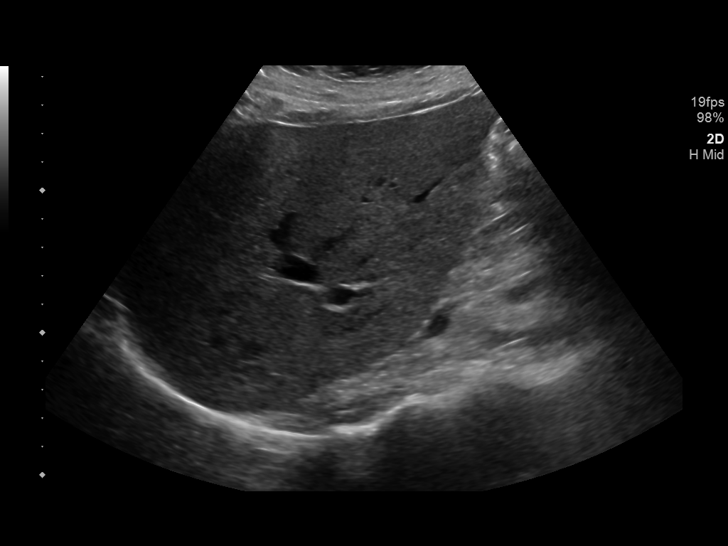
[im 3/31]
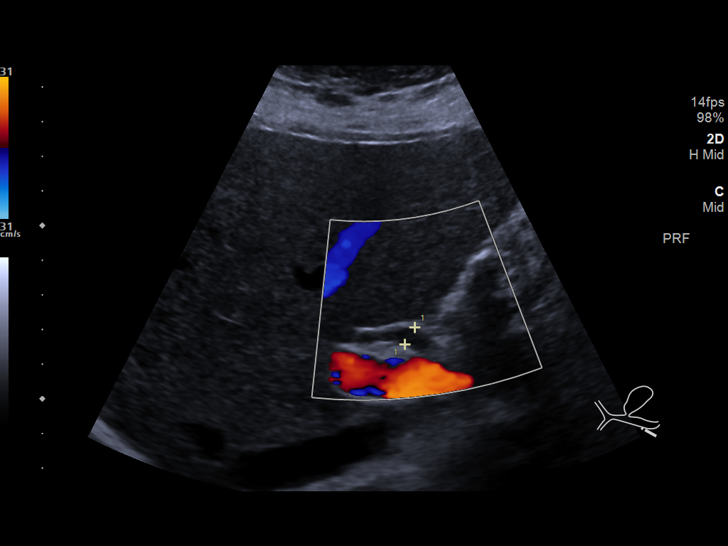
[im 6/31]
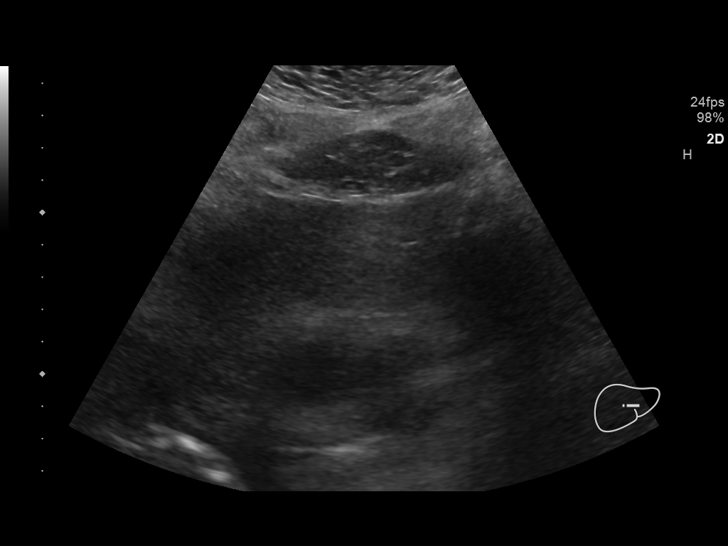
[im 8/31]
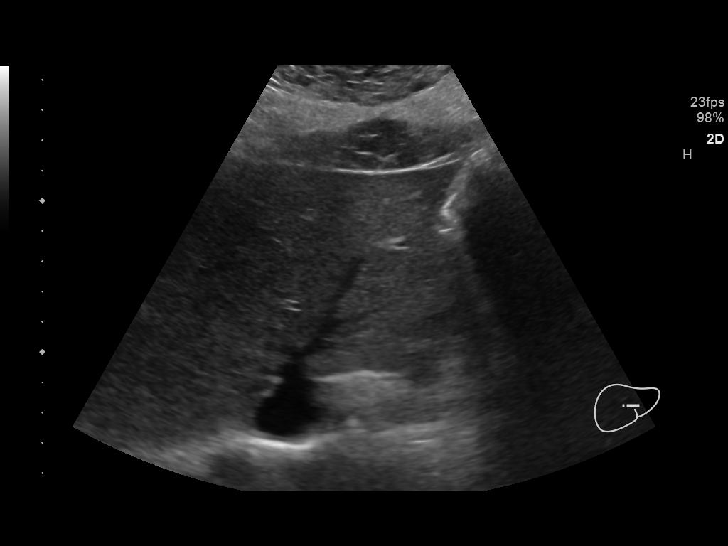
[im 11/31]
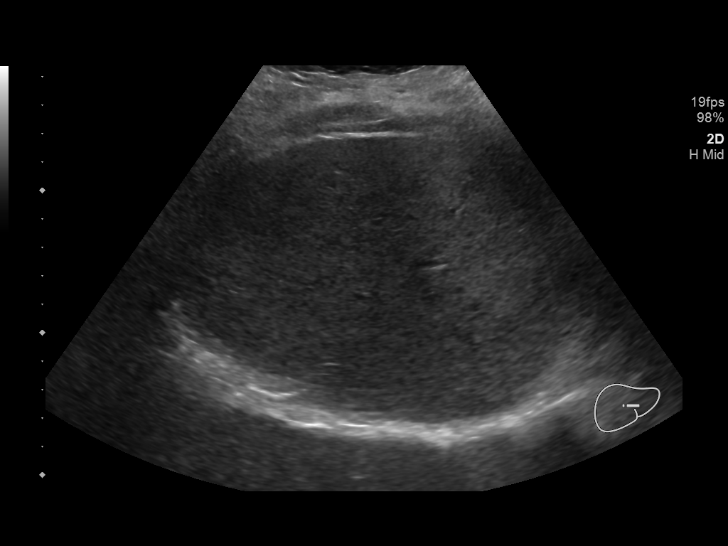
[im 12/31]
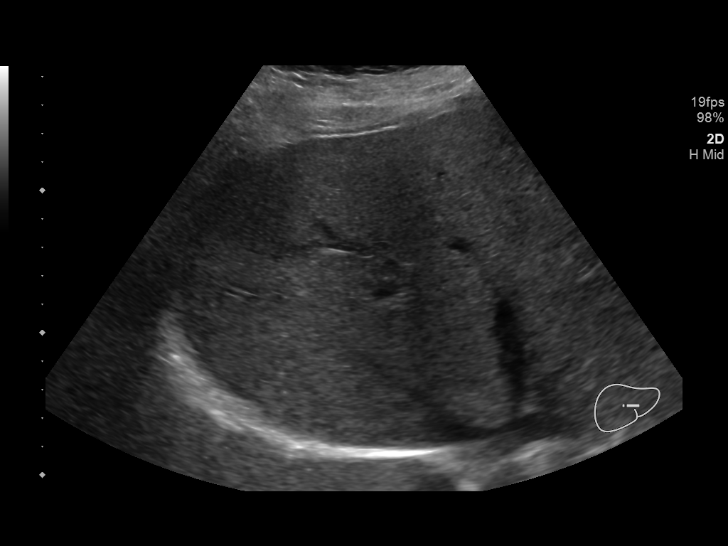
[im 14/31]
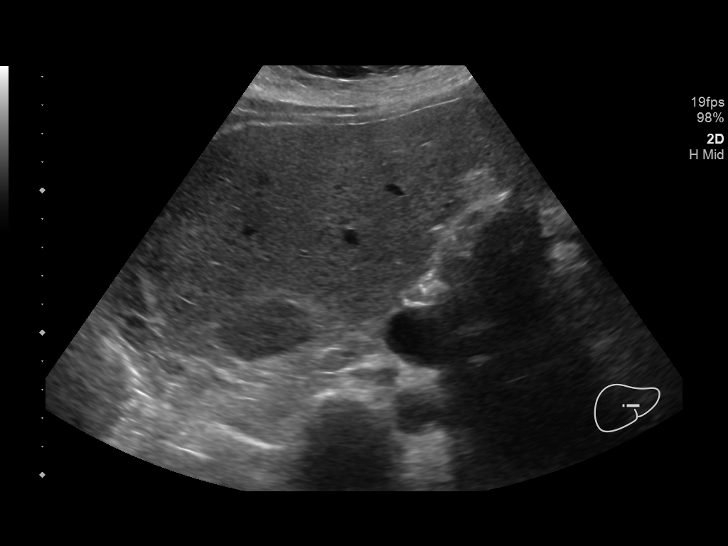
[im 17/31]
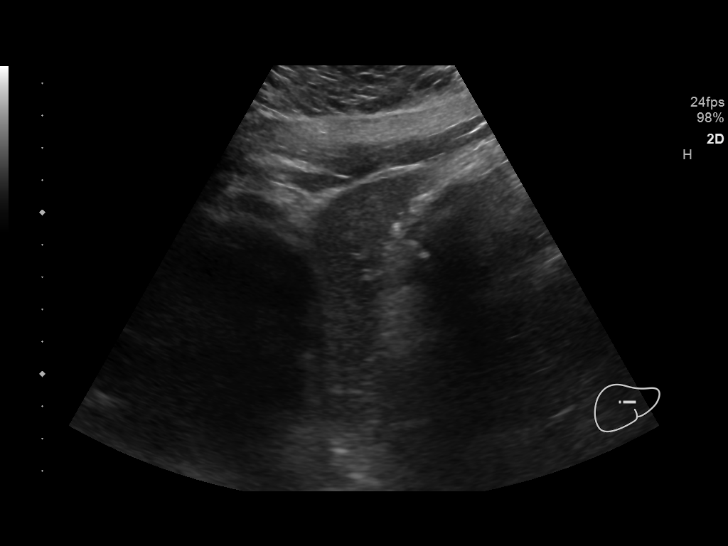
[im 19/31]
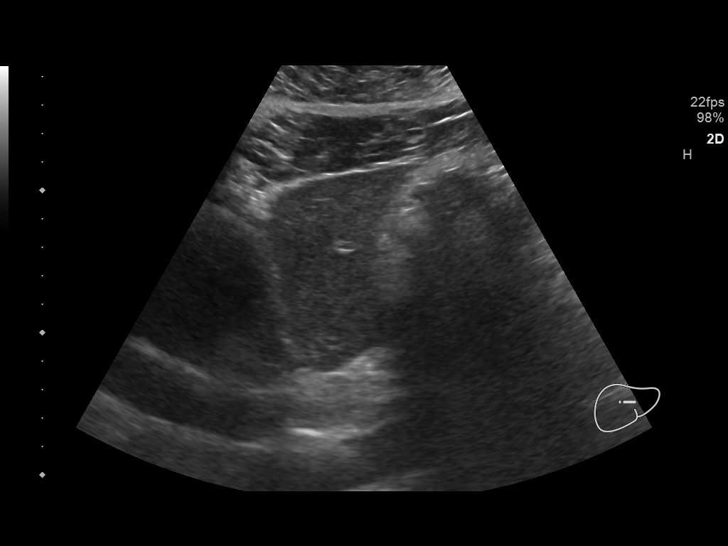
[im 21/31]
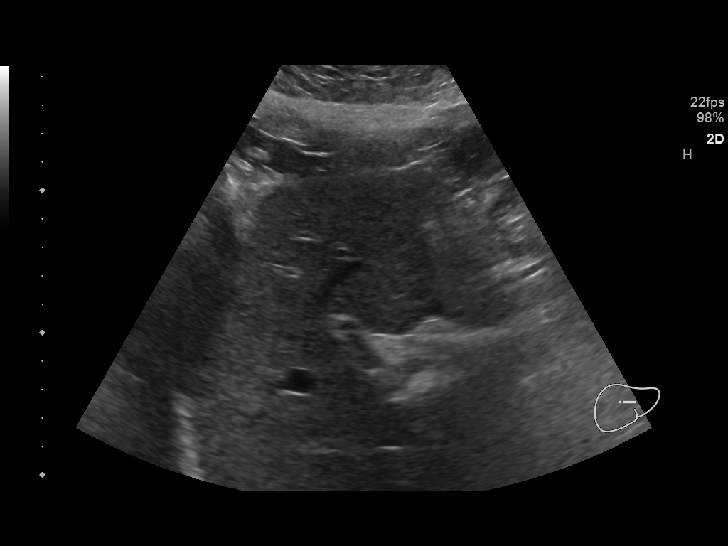
[im 23/31]
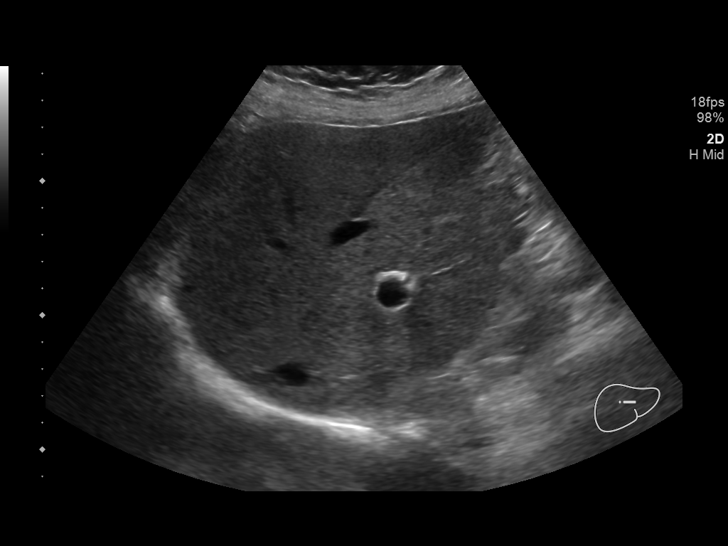
[im 26/31]
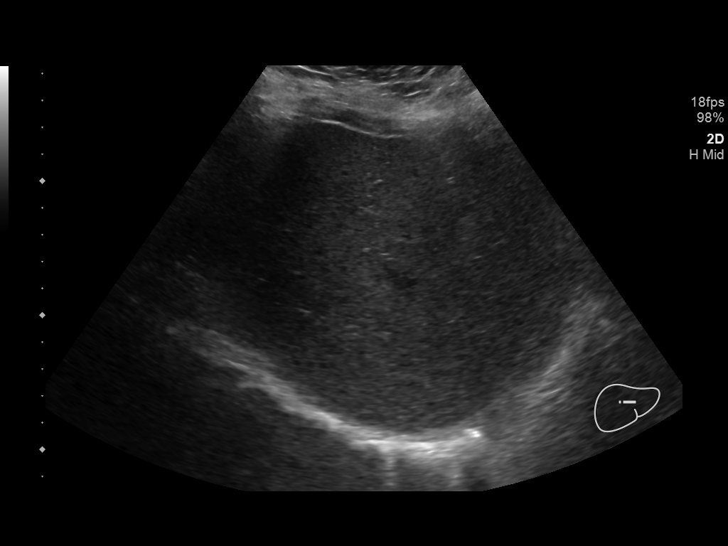
[im 28/31]
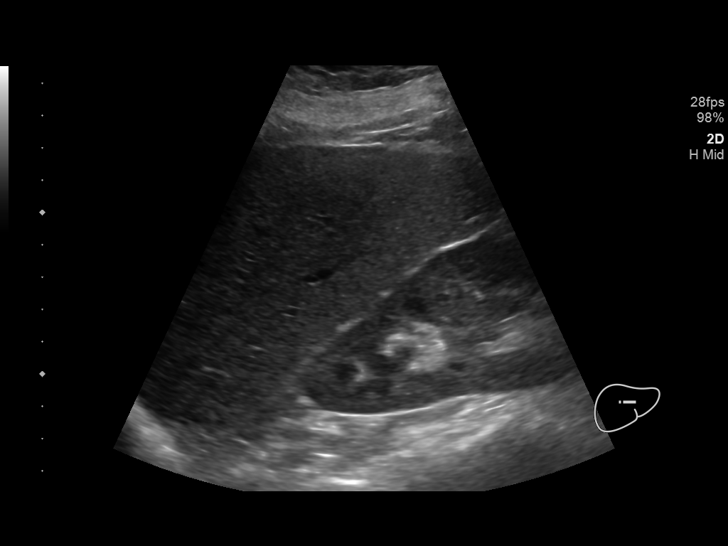
[im 31/31]
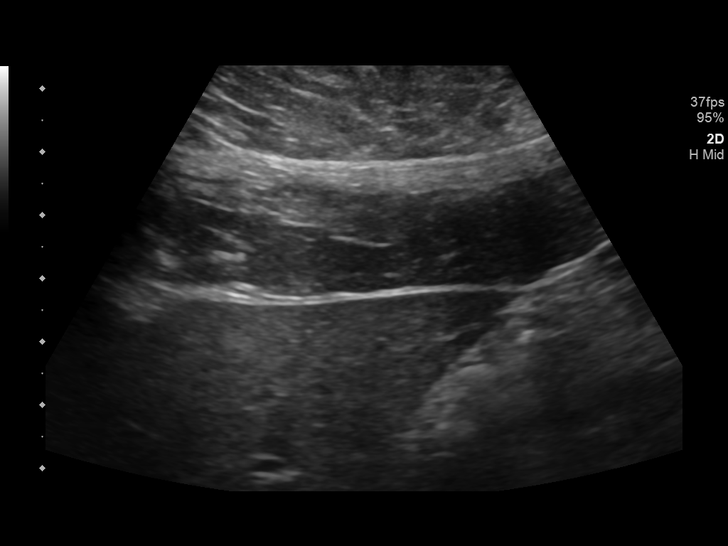

[14 of 25 positions shown; findings below may reference images not displayed]

FINDINGS: Gallbladder:

Previous cholecystectomy.

Common bile duct:

Diameter: 6 mm.  Normal.

Liver:

No focal lesion identified. Within normal limits in parenchymal
echogenicity. Portal vein is patent on color Doppler imaging with
normal direction of blood flow towards the liver.
IMPRESSION: Previous cholecystectomy. Otherwise normal exam. No abnormality seen
to explain the laboratory abnormalities.

## 2022-03-18 ENCOUNTER — Ambulatory Visit
Admission: EM | Admit: 2022-03-18 | Discharge: 2022-03-18 | Disposition: A | Discharge status: Home / Self Care | Payer: BC Managed Care – PPO

## 2022-03-18 ENCOUNTER — Ambulatory Visit (INDEPENDENT_AMBULATORY_CARE_PROVIDER_SITE_OTHER): Payer: BC Managed Care – PPO

## 2022-03-18 DIAGNOSIS — W2211XA Striking against or struck by driver side automobile airbag, initial encounter: Secondary | ICD-10-CM

## 2022-03-18 DIAGNOSIS — S63651A Sprain of metacarpophalangeal joint of left index finger, initial encounter: Secondary | ICD-10-CM | POA: Diagnosis not present

## 2022-03-18 DIAGNOSIS — M79642 Pain in left hand: Secondary | ICD-10-CM

## 2022-03-18 DIAGNOSIS — M25532 Pain in left wrist: Secondary | ICD-10-CM

## 2022-03-18 DIAGNOSIS — S6982XA Other specified injuries of left wrist, hand and finger(s), initial encounter: Secondary | ICD-10-CM
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
## 2022-07-15 ENCOUNTER — Emergency Department (HOSPITAL_BASED_OUTPATIENT_CLINIC_OR_DEPARTMENT_OTHER): Payer: BC Managed Care – PPO

## 2022-07-15 ENCOUNTER — Other Ambulatory Visit: Payer: Self-pay

## 2022-07-15 ENCOUNTER — Emergency Department (HOSPITAL_BASED_OUTPATIENT_CLINIC_OR_DEPARTMENT_OTHER)
Admission: EM | Admit: 2022-07-15 | Discharge: 2022-07-15 | Disposition: A | Discharge status: Home / Self Care | Payer: BC Managed Care – PPO | Attending: Emergency Medicine | Admitting: Emergency Medicine

## 2022-07-15 ENCOUNTER — Encounter (HOSPITAL_BASED_OUTPATIENT_CLINIC_OR_DEPARTMENT_OTHER): Payer: Self-pay | Admitting: Pediatrics

## 2022-07-15 DIAGNOSIS — I10 Essential (primary) hypertension: Secondary | ICD-10-CM | POA: Diagnosis not present

## 2022-07-15 DIAGNOSIS — Z79899 Other long term (current) drug therapy: Secondary | ICD-10-CM | POA: Diagnosis not present

## 2022-07-15 DIAGNOSIS — R519 Headache, unspecified: Secondary | ICD-10-CM

## 2022-07-15 LAB — CBC WITH DIFFERENTIAL/PLATELET
Abs Immature Granulocytes: 0.03 10*3/uL (ref 0.00–0.07)
Basophils Absolute: 0 10*3/uL (ref 0.0–0.1)
Basophils Relative: 0 %
Eosinophils Absolute: 0.1 10*3/uL (ref 0.0–0.5)
Eosinophils Relative: 2 %
HCT: 41.5 % (ref 36.0–46.0)
Hemoglobin: 14.2 g/dL (ref 12.0–15.0)
Immature Granulocytes: 0 %
Lymphocytes Relative: 23 %
Lymphs Abs: 2.1 10*3/uL (ref 0.7–4.0)
MCH: 28.9 pg (ref 26.0–34.0)
MCHC: 34.2 g/dL (ref 30.0–36.0)
MCV: 84.3 fL (ref 80.0–100.0)
Monocytes Absolute: 0.7 10*3/uL (ref 0.1–1.0)
Monocytes Relative: 8 %
Neutro Abs: 6.3 10*3/uL (ref 1.7–7.7)
Neutrophils Relative %: 67 %
Platelets: 221 10*3/uL (ref 150–400)
RBC: 4.92 MIL/uL (ref 3.87–5.11)
RDW: 12.2 % (ref 11.5–15.5)
WBC: 9.3 10*3/uL (ref 4.0–10.5)
nRBC: 0 % (ref 0.0–0.2)

## 2022-07-15 LAB — BASIC METABOLIC PANEL
Anion gap: 8 (ref 5–15)
BUN: 14 mg/dL (ref 6–20)
CO2: 26 mmol/L (ref 22–32)
Calcium: 9 mg/dL (ref 8.9–10.3)
Chloride: 103 mmol/L (ref 98–111)
Creatinine, Ser: 0.67 mg/dL (ref 0.44–1.00)
GFR, Estimated: >60 mL/min (ref >60)
Glucose, Bld: 94 mg/dL (ref 70–99)
Potassium: 3.4 mmol/L — ABNORMAL LOW (ref 3.5–5.1)
Sodium: 137 mmol/L (ref 135–145)

## 2022-07-15 LAB — GROUP A STREP BY PCR: Group A Strep by PCR: NOT DETECTED

## 2022-07-15 MED ORDER — KETOROLAC TROMETHAMINE 15 MG/ML IJ SOLN
15.0000 mg | Freq: Once | INTRAMUSCULAR | Status: AC
  Administered 2022-07-15: 15 mg via INTRAVENOUS
  Filled 2022-07-15: qty 1

## 2022-07-15 MED ORDER — DIPHENHYDRAMINE HCL 50 MG/ML IJ SOLN
50.0000 mg | Freq: Once | INTRAMUSCULAR | Status: AC
  Administered 2022-07-15: 50 mg via INTRAVENOUS
  Filled 2022-07-15: qty 1

## 2022-07-15 MED ORDER — METOCLOPRAMIDE HCL 5 MG/ML IJ SOLN
5.0000 mg | Freq: Once | INTRAMUSCULAR | Status: AC
  Administered 2022-07-15: 5 mg via INTRAVENOUS
  Filled 2022-07-15: qty 2

## 2022-07-15 NOTE — ED Triage Notes (Signed)
Reported cough x 3 weeks with negative work ups; here today for complaints of headache. Reports was seen at [facility] and had elevated BP readings; was told to double up on blood pressure medication if headache persist. + floaters; some nausea, denies vomiting; denies palpitations, + light sensitivity.

## 2022-07-15 NOTE — Discharge Instructions (Addendum)
Increase your amlodipine dose to 10 mg a day and follow-up with your primary doctor as discussed.  Return for strokelike symptoms, chest pain or new concerns.

## 2022-07-15 NOTE — ED Provider Notes (Signed)
[facility] EMERGENCY DEPARTMENT Provider Note   CSN: [id number] Arrival date & time: 07/15/22  1303     History  Chief Complaint  Patient presents with   Headache   Sore Throat    Tammy Benjamin is a 47 y.o. female.  Patient presents with multiple different symptoms.  Patient's had intermittent cough for 3 weeks and had negative COVID, negative flu and unremarkable chest x-ray recently at urgent care/primary doctor.  Patient's blood pressure has been running elevated 150s over 100 despite compliance with amlodipine 5 mg.  Patient presented due to persistent headache on the left has been intermittent it was not acute onset patient mild nausea with it.  Mild light sensitivity.  Patient had migraines years ago but nothing recent.  This is different than normal headache.  No headaches that wake her from sleep.  Patient is a Education officer, [employer].  No neurologic signs or symptoms.       Home Medications Prior to Admission medications   Medication Sig Start Date End Date Taking? Authorizing Provider  amLODipine (NORVASC) 5 MG tablet Take 5 mg by mouth daily. 12/29/21   [provider]      Allergies    Bee venom, Hydrocodone, Petroleum jelly white [petrolatum], and Penicillins    Review of Systems   Review of Systems  Constitutional:  Negative for chills and fever.  HENT:  Positive for congestion.   Eyes:  Negative for visual disturbance.  Respiratory:  Positive for cough. Negative for shortness of breath.   Cardiovascular:  Negative for chest pain.  Gastrointestinal:  Negative for abdominal pain and vomiting.  Genitourinary:  Negative for dysuria and flank pain.  Musculoskeletal:  Negative for back pain, neck pain and neck stiffness.  Skin:  Negative for rash.  Neurological:  Positive for light-headedness and headaches.    Physical Exam Updated Vital Signs BP (!) 153/90 (BP Location: Right Arm)   Pulse 95   Temp 98.3 F (36.8 C) (Oral)   Resp 18   Ht 5'  (1.524 m)   Wt 93 kg   SpO2 100%   BMI 40.04 kg/m  Physical Exam Vitals and nursing note reviewed.  Constitutional:      General: She is not in acute distress.    Appearance: She is well-developed.  HENT:     Head: Normocephalic and atraumatic.     Comments: No trismus, uvular deviation, unilateral posterior pharyngeal edema or submandibular swelling.     Mouth/Throat:     Mouth: Mucous membranes are moist.  Eyes:     General:        Right eye: No discharge.        Left eye: No discharge.     Extraocular Movements: Extraocular movements intact.     Conjunctiva/sclera: Conjunctivae normal.     Pupils: Pupils are equal, round, and reactive to light.  Neck:     Trachea: No tracheal deviation.  Cardiovascular:     Rate and Rhythm: Normal rate and regular rhythm.     Heart sounds: No murmur heard. Pulmonary:     Effort: Pulmonary effort is normal.     Breath sounds: Normal breath sounds.  Abdominal:     General: There is no distension.     Palpations: Abdomen is soft.     Tenderness: There is no abdominal tenderness. There is no guarding.  Musculoskeletal:     Cervical back: Normal range of motion and neck supple. No rigidity.  Skin:    General: Skin  is warm.     Capillary Refill: Capillary refill takes less than 2 seconds.     Findings: No rash.  Neurological:     General: No focal deficit present.     Mental Status: She is alert.     GCS: GCS eye subscore is 4. GCS verbal subscore is 5. GCS motor subscore is 6.     Cranial Nerves: No cranial nerve deficit or dysarthria.     Sensory: No sensory deficit.     Motor: No weakness.     Coordination: Coordination normal.  Psychiatric:        Mood and Affect: Mood normal.     ED Results / Procedures / Treatments   Labs (all labs ordered are listed, but only abnormal results are displayed) Labs Reviewed  GROUP A STREP BY PCR  CBC WITH DIFFERENTIAL/PLATELET  BASIC METABOLIC PANEL    EKG None  Radiology No results  found.  Procedures Procedures    Medications Ordered in ED Medications  diphenhydrAMINE (BENADRYL) injection 50 mg (50 mg Intravenous Given 07/15/22 1449)  ketorolac (TORADOL) 15 MG/ML injection 15 mg (15 mg Intravenous Given 07/15/22 1447)  metoCLOPramide (REGLAN) injection 5 mg (5 mg Intravenous Given 07/15/22 1446)    ED Course/ Medical Decision Making/ A&P                           Medical Decision Making Amount and/or Complexity of Data Reviewed Labs: ordered. Radiology: ordered.  Risk Prescription drug management.   Patient presents with clinical concern for flulike illness/viral respiratory infection, patient had recent workup done unremarkable outpatient and strep test added and negative here and no signs of peritonsillar abscess on exam.  Patient overall well-hydrated.  Headache concern discussed likely secondary to viral infection however different than previous and blood pressure elevated so plan for CT head to look for any signs of hypertensive bleed, normal neurologic exam.  Discussed headache medicines, screening blood work check kidney function electrolytes and close outpatient follow-up.  Discussed amlodipine increasing to 10 mg daily patient understands this.  Patient care signed out to follow-up results for final disposition.        Final Clinical Impression(s) / ED Diagnoses Final diagnoses:  Headache, unspecified headache type  Hypertension, unspecified type    Rx / DC Orders ED Discharge Orders     None         Elnora Morrison, MD 07/15/22 1457

## 2022-07-15 NOTE — ED Provider Notes (Signed)
  Physical Exam  BP 133/85 (BP Location: Left Arm)   Pulse 83   Temp 97.8 F (36.6 C) (Oral)   Resp 18   Ht 5' (1.524 m)   Wt 93 kg   SpO2 100%   BMI 40.04 kg/m   Physical Exam  Procedures  Procedures  ED Course / MDM    Medical Decision Making Amount and/or Complexity of Data Reviewed Labs: ordered. Radiology: ordered.  Risk Prescription drug management.   Received care of pt from Dr. Reather Converse. Please see his noet for hx, physical and care.  Presented with headache, has also had URI symptoms .    CT head completed and shows no sign of ICH.  Labs personally evluated by me and shows no anemia, leukocytosis, no significant electrolyte abnormaliites, strep negative.   Dr. Reather Converse had discussed increase in amlodipine dosing from 5mg  to 10mg  given elevated BP.  Recommend PCP follow up.       Gareth Morgan, MD 07/17/22 1043
